# Patient Record
Sex: Male | Born: 1935 | Race: White | Hispanic: No | Marital: Married | Smoking: Never smoker
Health system: Southern US, Community
[De-identification: ages and names within clinical notes are randomized; demographics above are authoritative.]

## PROBLEM LIST (undated history)

## (undated) DIAGNOSIS — C189 Malignant neoplasm of colon, unspecified: Secondary | ICD-10-CM

## (undated) DIAGNOSIS — I10 Essential (primary) hypertension: Secondary | ICD-10-CM

## (undated) DIAGNOSIS — C61 Malignant neoplasm of prostate: Secondary | ICD-10-CM

## (undated) DIAGNOSIS — E7800 Pure hypercholesterolemia, unspecified: Secondary | ICD-10-CM

## (undated) DIAGNOSIS — Z9289 Personal history of other medical treatment: Secondary | ICD-10-CM

## (undated) DIAGNOSIS — IMO0002 Reserved for concepts with insufficient information to code with codable children: Secondary | ICD-10-CM

## (undated) DIAGNOSIS — M5126 Other intervertebral disc displacement, lumbar region: Secondary | ICD-10-CM

## (undated) DIAGNOSIS — Z923 Personal history of irradiation: Secondary | ICD-10-CM

## (undated) HISTORY — PX: SHOULDER ARTHROSCOPY: SHX128

## (undated) HISTORY — DX: Pure hypercholesterolemia, unspecified: E78.00

## (undated) HISTORY — DX: Malignant neoplasm of prostate: C61

## (undated) HISTORY — DX: Essential (primary) hypertension: I10

## (undated) HISTORY — PX: CARDIAC CATHETERIZATION: SHX172

## (undated) HISTORY — DX: Personal history of other medical treatment: Z92.89

---

## 1991-06-13 HISTORY — PX: PROSTATECTOMY: SHX69

## 1996-06-12 DIAGNOSIS — IMO0001 Reserved for inherently not codable concepts without codable children: Secondary | ICD-10-CM

## 1996-06-12 HISTORY — DX: Reserved for inherently not codable concepts without codable children: IMO0001

## 1997-10-26 ENCOUNTER — Other Ambulatory Visit: Admission: RE | Admit: 1997-10-26 | Discharge: 1997-10-26 | Payer: Self-pay | Admitting: Cardiology

## 1998-05-18 ENCOUNTER — Ambulatory Visit (HOSPITAL_COMMUNITY): Admission: RE | Admit: 1998-05-18 | Discharge: 1998-05-18 | Payer: Self-pay | Admitting: Cardiology

## 1998-05-18 ENCOUNTER — Encounter: Payer: Self-pay | Admitting: Cardiology

## 2000-03-09 ENCOUNTER — Inpatient Hospital Stay (HOSPITAL_COMMUNITY): Admission: AD | Admit: 2000-03-09 | Discharge: 2000-03-19 | Payer: Self-pay

## 2000-03-19 ENCOUNTER — Inpatient Hospital Stay (HOSPITAL_COMMUNITY)
Admission: RE | Admit: 2000-03-19 | Discharge: 2000-03-22 | Payer: Self-pay | Admitting: Physical Medicine & Rehabilitation

## 2002-02-25 ENCOUNTER — Ambulatory Visit (HOSPITAL_COMMUNITY): Admission: RE | Admit: 2002-02-25 | Discharge: 2002-02-25 | Payer: Self-pay

## 2002-08-12 ENCOUNTER — Ambulatory Visit (HOSPITAL_COMMUNITY): Admission: RE | Admit: 2002-08-12 | Discharge: 2002-08-12 | Payer: Self-pay | Admitting: Gastroenterology

## 2002-09-12 ENCOUNTER — Ambulatory Visit (HOSPITAL_COMMUNITY): Admission: RE | Admit: 2002-09-12 | Discharge: 2002-09-12 | Payer: Self-pay | Admitting: Cardiology

## 2004-05-17 ENCOUNTER — Encounter: Admission: RE | Admit: 2004-05-17 | Discharge: 2004-05-17 | Payer: Self-pay | Admitting: Orthopedic Surgery

## 2004-05-19 ENCOUNTER — Ambulatory Visit (HOSPITAL_BASED_OUTPATIENT_CLINIC_OR_DEPARTMENT_OTHER): Admission: RE | Admit: 2004-05-19 | Discharge: 2004-05-19 | Payer: Self-pay | Admitting: Orthopedic Surgery

## 2004-05-19 ENCOUNTER — Ambulatory Visit (HOSPITAL_COMMUNITY): Admission: RE | Admit: 2004-05-19 | Discharge: 2004-05-19 | Payer: Self-pay | Admitting: Orthopedic Surgery

## 2006-09-24 ENCOUNTER — Ambulatory Visit: Payer: Self-pay | Admitting: Vascular Surgery

## 2008-10-10 DIAGNOSIS — Z9289 Personal history of other medical treatment: Secondary | ICD-10-CM

## 2008-10-10 HISTORY — DX: Personal history of other medical treatment: Z92.89

## 2008-10-14 ENCOUNTER — Encounter: Payer: Self-pay | Admitting: Cardiology

## 2010-04-14 ENCOUNTER — Ambulatory Visit: Payer: Self-pay | Admitting: Cardiology

## 2010-06-12 HISTORY — PX: OTHER SURGICAL HISTORY: SHX169

## 2010-10-16 ENCOUNTER — Other Ambulatory Visit: Payer: Self-pay | Admitting: Cardiology

## 2010-10-16 DIAGNOSIS — I10 Essential (primary) hypertension: Secondary | ICD-10-CM

## 2010-10-20 ENCOUNTER — Other Ambulatory Visit: Payer: Self-pay | Admitting: Cardiology

## 2010-10-28 ENCOUNTER — Other Ambulatory Visit: Payer: Self-pay | Admitting: *Deleted

## 2010-10-28 ENCOUNTER — Encounter: Payer: Self-pay | Admitting: Cardiology

## 2010-10-28 DIAGNOSIS — E7800 Pure hypercholesterolemia, unspecified: Secondary | ICD-10-CM
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
## 2010-10-28 NOTE — Op Note (Signed)
NAME:  Tony James, Tony James NO.:  [id number]   MEDICAL RECORD NO.:  [id number]          PATIENT TYPE:  AMB   LOCATION:  DSC                          FACILITY:  [facility]   PHYSICIAN:  Loreta Ave, M.D. DATE OF BIRTH:  12-20-1922   DATE OF PROCEDURE:  05/19/2004  DATE OF DISCHARGE:                                 OPERATIVE REPORT   PREOPERATIVE DIAGNOSIS:  Impingement, partial rotator cuff tear,  degenerative joint disease acromioclavicular joint, adhesive capsulitis,  left shoulder.   POSTOPERATIVE DIAGNOSIS:  Impingement, partial rotator cuff tear,  degenerative joint disease acromioclavicular joint, adhesive capsulitis,  left shoulder.   OPERATION PERFORMED:  Left shoulder exam and manipulation under anesthesia.  Arthroscopy with debridement of labrum, removal of loose bodies, debridement  of glenohumeral joint.  Acromioplasty with coracoacromial ligament release.  Excision of distal clavicle.   SURGEON:  Loreta Ave, M.D.   ASSISTANT:  Genene Churn. Denton Meek.   ANESTHESIA:  General.   ESTIMATED BLOOD LOSS:  Minimal.   SPECIMENS:  None.   CULTURES:  None.   COMPLICATIONS:  None.   DRESSING:  Soft compressive with sling.   DESCRIPTION OF PROCEDURE:  The patient was brought to the operating room and  after adequate anesthesia had been obtained, the left shoulder was examined.  Motion reduced 50% all planes from adhesions.  With gentle manipulation  breaking up adhesions, achieved full motion, maintaining stable shoulder  without adverse occurrence.  Placed in a beach chair position on the  shoulder positioner, prepped and draped in the usual sterile fashion.  Three  standard arthroscopic portals, anterior, posterior lateral.  Shoulder  entered with blunt obturator, distended and inspected.  Grade 2 and 3 and in  some areas approaching grade four changes, glenohumeral joint mostly changes  on the humerus.  Numerous chondral loose bodies removed.   Superior labrum  tear with partially tethered loose bodies, all of this debrided.  Biceps  tendon, biceps anchor were still intact as were capsular ligamentous  structures.  Obvious tearing of inferior capsule from his manipulation.  Undersurface of rotator cuff intact but a little bit thinned.  Once the  glenohumeral joint debrided, cannula redirected subacromially.  Type 3  acromion, chronic impingement.  Bursa resected, cuff debrided.  No full  thickness tears.  Acromioplasty from a type 3 to a type 1 acromion releasing  coracoacromial ligament.  Distal clavicle grade 4 changes with marked spurs.  Lateral centimeter resected as well as periarticular spurs.  Adequacy of  decompression, clavicle excision, cuff debridement confirmed viewing from  all  portals.  Instruments and fluid removed.  Portals of shoulder and bursa  injected with Marcaine.  Portals closed with 4-0 nylon.  Sterile compressive  dressing with sling applied.  Anesthesia reversed.  Brought to recovery  room.  Tolerated surgery well without complication.      Valentino Saxon   DFM/MEDQ  D:  05/19/2004  T:  05/20/2004  Job:  [id number]

## 2010-10-28 NOTE — Cardiovascular Report (Signed)
NAME:  Tony James, Tony James                           ACCOUNT NO.:  [id number]   MEDICAL RECORD NO.:  [id number]                   PATIENT TYPE:  OIB   LOCATION:  2899                                 FACILITY:  [facility]   PHYSICIAN:  Colleen Can. Deborah Chalk, M.D.            DATE OF BIRTH:  1922-08-31   DATE OF PROCEDURE:  DATE OF DISCHARGE:                              CARDIAC CATHETERIZATION   HISTORY:  The patient has longstanding hypertension.  He had an abnormal  exercise tolerance test and is referred for cardiac catheterization.   PROCEDURE:  Left heart catheterization with selective coronary angiography,  left ventricular angiography, and abdominal aortic angiography.   TYPE AND SITE OF ENTRY:  Percutaneous right femoral artery.   CATHETERS:  Judkins 6-French 4-curved right and left coronary catheters, 6-  French pigtail ventriculographic catheter.   MEDICATIONS GIVEN PRIOR TO PROCEDURE:  Valium 10 mg p.o.   MEDICATIONS GIVEN DURING PROCEDURE:  Versed 3 mg IV, Ancef 1 gm IV.   COMMENTS:  The patient tolerated the procedure well.  Perclose was used.   HEMODYNAMIC DATA:  1. The aortic pressure was 118/83.  2. LV was 127/4-14.  3. There was no aortic valve gradient noted on pullback.   ANGIOGRAPHIC DATA:  1. The abdominal aortogram was normal.  The aorta was smooth and tapered.     There was no aneurysmal dilatation.  Renal arteries were normal.  2. Left ventricular angiogram:  Left ventricular angiogram was performed in     the RAO position.  Overall cardiac size and silhouette were normal.     Global ejection fraction would be estimated to be 60%.  There was no     mitral regurgitation, intracardiac calcification, or intracavitary     filling defects.  3. Coronary arteries     A. Right coronary artery:  The right coronary artery was a moderately        large dominant vessel that extends to the apex.  There are 2 large        inferior branches, including posterolateral branch and  posterior        descending vessel.  There were normal.  4. Left main coronary artery:  This was short and normal.  5. Left circumflex:  The left circumflex continued as an obtuse marginal but     3 branches.  There was mild tortuosity.  It was normal.  6. Left anterior descending:  The left anterior descending was a reasonably     large system.  There were 2 large diagonal vessels.  It was normal.    OVERALL IMPRESSION:  1. Normal left ventricular function.  2. Normal coronary arteries.  3. Normal abdominal aortogram.  Colleen Can. Deborah Chalk, M.D.    SNT/MEDQ  D:  09/12/2002  T:  09/14/2002  Job:  [id number]

## 2010-10-31 ENCOUNTER — Other Ambulatory Visit (INDEPENDENT_AMBULATORY_CARE_PROVIDER_SITE_OTHER): Payer: Medicare Other | Admitting: *Deleted

## 2010-10-31 ENCOUNTER — Encounter: Payer: Self-pay | Admitting: Cardiology

## 2010-10-31 ENCOUNTER — Ambulatory Visit (INDEPENDENT_AMBULATORY_CARE_PROVIDER_SITE_OTHER): Payer: Medicare Other | Admitting: Cardiology

## 2010-10-31 DIAGNOSIS — I119 Hypertensive heart disease without heart failure: Secondary | ICD-10-CM

## 2010-10-31 DIAGNOSIS — E7800 Pure hypercholesterolemia, unspecified: Secondary | ICD-10-CM

## 2010-10-31 DIAGNOSIS — I10 Essential (primary) hypertension: Secondary | ICD-10-CM | POA: Insufficient documentation

## 2010-10-31 LAB — HEPATIC FUNCTION PANEL
AST: 25 U/L (ref 0–37)
Alkaline Phosphatase: 41 U/L (ref 39–117)
Bilirubin, Direct: 0.1 mg/dL (ref 0.0–0.3)
Total Bilirubin: 0.7 mg/dL (ref 0.3–1.2)

## 2010-10-31 LAB — LIPID PANEL
LDL Cholesterol: 79 mg/dL (ref 0–99)
VLDL: 23.4 mg/dL (ref 0.0–40.0)

## 2010-10-31 LAB — BASIC METABOLIC PANEL
GFR: 49.9 mL/min — ABNORMAL LOW (ref >60.00)
Potassium: 4.7 mEq/L (ref 3.5–5.1)
Sodium: 138 mEq/L (ref 135–145)

## 2010-10-31 NOTE — Assessment & Plan Note (Signed)
We'll continue Lipitor and Zetia.

## 2010-10-31 NOTE — Assessment & Plan Note (Signed)
We will continue current medicines. Lab work is pending. I will have him see [facility] for followup. Appointments will be made. We'll continue current medicines.

## 2010-10-31 NOTE — Progress Notes (Signed)
Subjective:   Tony James is seen today for followup visit. In general, he's doing well. He had cataract surgery by Dr. Mckinley Jewel and has new hearing aids and is going quite well. Blood pressure readings at home have been excellent. He talked about my retirement. He has some mild hypertensive heart disease with mild LVH and mild renal insufficiency and basically has minimal cardiovascular issues. He had remote cardiac catheterization in 2004 with normal coronary arteries.  In April 2010, his BUN was 32 with creatinine of 1.4. He's had a total prostatectomy in 1993 for prostate cancer and subsequent had radiation therapy 1998. He is on hormone therapy but that was discontinued in 2006. There may be a slight return of increase in his PSA. He has a history of hypercholesterolemia on statin therapy.  Current Outpatient Prescriptions  Medication Sig Dispense Refill  . aspirin 81 MG tablet Take 81 mg by mouth daily.        Marland Kitchen atenolol (TENORMIN) 50 MG tablet TAKE ONE-HALF (1/2) TABLET IN THE MORNING AND ONE TABLET IN THE EVENING  135 tablet  2  . atorvastatin (LIPITOR) 40 MG tablet Take 40 mg by mouth daily.        . Calcium Carbonate-Vitamin D (CALCIUM + D) 600-200 MG-UNIT TABS Take 1 tablet by mouth 2 (two) times daily.        . enalapril (VASOTEC) 20 MG tablet Take 1 tablet (20 mg total) by mouth daily.  90 tablet  2  . ezetimibe (ZETIA) 10 MG tablet Take 10 mg by mouth daily.        . felodipine (PLENDIL) 10 MG 24 hr tablet Take 10 mg by mouth daily.        . hydrochlorothiazide 25 MG tablet Take 25 mg by mouth daily. Take every other day         No Known Allergies  Patient Active Problem List  Diagnoses  . Hypertension  . Hypercholesterolemia  . Hypertensive heart disease    History  Smoking status  . Never Smoker   Smokeless tobacco  . Never Used    History  Alcohol Use  . Yes    social use only    Family History  Problem Relation Age of Onset  . Heart disease Father      Review of Systems:   The patient denies any heat or cold intolerance.  No weight gain or weight loss.  The patient denies headaches or blurry vision.  There is no cough or sputum production.  The patient denies dizziness.  There is no hematuria or hematochezia.  The patient denies any muscle aches or arthritis.  The patient denies any rash.  The patient denies frequent falling or instability.  There is no history of depression or anxiety.  All other systems were reviewed and are negative.   Physical Exam:   Weight is 217. Blood pressure is 140/70 sitting, heart rate 60.The head is normocephalic and atraumatic.  Pupils are equally round and reactive to light.  Sclerae nonicteric.  Conjunctiva is clear.  Oropharynx is unremarkable.  There's adequate oral airway.  Neck is supple there are no masses.  Thyroid is not enlarged.  There is no lymphadenopathy.  Lungs are clear.  Chest is symmetric.  Heart shows a regular rate and rhythm.  S1 and S2 are normal.  There is no murmur click or gallop.  Abdomen is soft normal bowel sounds.  There is no organomegaly.  Genital and rectal deferred.  Extremities are without  edema.  Peripheral pulses are adequate.  Neurologically intact.  Full range of motion.  The patient is not depressed.  Skin is warm and dry.  Assessment / Plan:

## 2010-10-31 NOTE — Progress Notes (Signed)
Subjective:     Current Outpatient Prescriptions  Medication Sig Dispense Refill  . aspirin 81 MG tablet Take 81 mg by mouth daily.        Marland Kitchen atenolol (TENORMIN) 50 MG tablet TAKE ONE-HALF (1/2) TABLET IN THE MORNING AND ONE TABLET IN THE EVENING  135 tablet  2  . atorvastatin (LIPITOR) 40 MG tablet Take 40 mg by mouth daily.        . Calcium Carbonate-Vitamin D (CALCIUM + D) 600-200 MG-UNIT TABS Take 1 tablet by mouth 2 (two) times daily.        . enalapril (VASOTEC) 20 MG tablet Take 1 tablet (20 mg total) by mouth daily.  90 tablet  2  . ezetimibe (ZETIA) 10 MG tablet Take 10 mg by mouth daily.        . felodipine (PLENDIL) 10 MG 24 hr tablet Take 10 mg by mouth daily.        . hydrochlorothiazide 25 MG tablet Take 25 mg by mouth daily. Take every other day         No Known Allergies  Patient Active Problem List  Diagnoses  . Hypertension  . Hypercholesterolemia  . Hypertensive heart disease    History  Smoking status  . Never Smoker   Smokeless tobacco  . Never Used    History  Alcohol Use  . Yes    social use only    Family History  Problem Relation Age of Onset  . Heart disease Father     Review of Systems:   The patient denies any heat or cold intolerance.  No weight gain or weight loss.  The patient denies headaches or blurry vision.  There is no cough or sputum production.  The patient denies dizziness.  There is no hematuria or hematochezia.  The patient denies any muscle aches or arthritis.  The patient denies any rash.  The patient denies frequent falling or instability.  There is no history of depression or anxiety.  All other systems were reviewed and are negative.   Physical Exam:    Assessment / Plan:

## 2010-11-01 ENCOUNTER — Telehealth: Payer: Self-pay | Admitting: *Deleted

## 2010-11-01 NOTE — Telephone Encounter (Signed)
Labs reported 

## 2011-01-20 ENCOUNTER — Other Ambulatory Visit: Payer: Self-pay | Admitting: Cardiology

## 2011-01-20 NOTE — Telephone Encounter (Signed)
escribe medication per fax request  

## 2011-04-14 ENCOUNTER — Other Ambulatory Visit: Payer: Self-pay | Admitting: Nurse Practitioner

## 2011-06-21 ENCOUNTER — Other Ambulatory Visit: Payer: Self-pay | Admitting: *Deleted

## 2011-06-21 DIAGNOSIS — I10 Essential (primary) hypertension: Secondary | ICD-10-CM

## 2011-06-21 MED ORDER — ENALAPRIL MALEATE 20 MG PO TABS: 20.0000 mg | ORAL_TABLET | Freq: Every day | ORAL | Status: DC

## 2012-09-03 ENCOUNTER — Other Ambulatory Visit: Payer: Self-pay | Admitting: Gastroenterology

## 2012-09-03 DIAGNOSIS — C20 Malignant neoplasm of rectum: Secondary | ICD-10-CM

## 2012-09-03 DIAGNOSIS — C189 Malignant neoplasm of colon, unspecified: Secondary | ICD-10-CM

## 2012-09-03 HISTORY — PX: OTHER SURGICAL HISTORY: SHX169

## 2012-09-03 HISTORY — DX: Malignant neoplasm of colon, unspecified: C18.9

## 2012-09-04 ENCOUNTER — Other Ambulatory Visit: Payer: Self-pay | Admitting: Gastroenterology

## 2012-09-04 DIAGNOSIS — C20 Malignant neoplasm of rectum: Secondary | ICD-10-CM

## 2012-09-05 ENCOUNTER — Other Ambulatory Visit: Payer: Self-pay | Admitting: Gastroenterology

## 2012-09-05 ENCOUNTER — Ambulatory Visit
Admission: RE | Admit: 2012-09-05 | Discharge: 2012-09-05 | Disposition: A | Discharge status: Home / Self Care | Payer: Medicare Other | Source: Ambulatory Visit | Attending: Gastroenterology | Admitting: Gastroenterology

## 2012-09-05 ENCOUNTER — Telehealth: Payer: Self-pay | Admitting: *Deleted

## 2012-09-05 DIAGNOSIS — C20 Malignant neoplasm of rectum: Secondary | ICD-10-CM

## 2012-09-05 MED ORDER — IOHEXOL 300 MG/ML  SOLN
125.0000 mL | Freq: Once | INTRAMUSCULAR | Status: AC | PRN
  Administered 2012-09-05: 125 mL via INTRAVENOUS

## 2012-09-05 NOTE — Telephone Encounter (Signed)
Spoke with patient by phone and confirmed appointment with Dr. Truett Perna for 09/13/12.  Contact names and phone numbers were provided.

## 2012-09-05 NOTE — Telephone Encounter (Signed)
Pt scheduled per [name]. Welcome packet mailed.

## 2012-09-05 NOTE — Addendum Note (Signed)
Addended by: Willis Modena on: 09/05/2012 05:32 PM   Modules accepted: Orders

## 2012-09-06 ENCOUNTER — Encounter (HOSPITAL_COMMUNITY)
Admission: RE | Disposition: A | Discharge status: Home / Self Care | Payer: Self-pay | Source: Ambulatory Visit | Attending: Gastroenterology

## 2012-09-06 ENCOUNTER — Ambulatory Visit (HOSPITAL_COMMUNITY)
Admission: RE | Admit: 2012-09-06 | Discharge: 2012-09-06 | Disposition: A | Discharge status: Home / Self Care | Payer: Medicare Other | Source: Ambulatory Visit | Attending: Gastroenterology | Admitting: Gastroenterology

## 2012-09-06 ENCOUNTER — Encounter (HOSPITAL_COMMUNITY): Payer: Self-pay | Admitting: *Deleted

## 2012-09-06 DIAGNOSIS — C20 Malignant neoplasm of rectum: Secondary | ICD-10-CM | POA: Insufficient documentation

## 2012-09-06 DIAGNOSIS — Z9079 Acquired absence of other genital organ(s): Secondary | ICD-10-CM | POA: Insufficient documentation

## 2012-09-06 HISTORY — PX: EUS: SHX5427

## 2012-09-06 HISTORY — DX: Other intervertebral disc displacement, lumbar region: M51.26

## 2012-09-06 SURGERY — ULTRASOUND, LOWER GI TRACT, ENDOSCOPIC
Anesthesia: Moderate Sedation

## 2012-09-06 MED ORDER — SODIUM CHLORIDE 0.9 % IV SOLN
INTRAVENOUS | Status: DC
  Administered 2012-09-06: 500 mL via INTRAVENOUS

## 2012-09-06 MED ORDER — MIDAZOLAM HCL 10 MG/2ML IJ SOLN
INTRAMUSCULAR | Status: DC | PRN
  Administered 2012-09-06: 2 mg via INTRAVENOUS

## 2012-09-06 MED ORDER — DIPHENHYDRAMINE HCL 50 MG/ML IJ SOLN
INTRAMUSCULAR | Status: AC
  Filled 2012-09-06: qty 1

## 2012-09-06 MED ORDER — FENTANYL CITRATE 0.05 MG/ML IJ SOLN
INTRAMUSCULAR | Status: DC | PRN
  Administered 2012-09-06: 25 ug via INTRAVENOUS

## 2012-09-06 MED ORDER — MIDAZOLAM HCL 10 MG/2ML IJ SOLN
INTRAMUSCULAR | Status: AC
  Filled 2012-09-06: qty 2

## 2012-09-06 MED ORDER — FENTANYL CITRATE 0.05 MG/ML IJ SOLN
INTRAMUSCULAR | Status: AC
  Filled 2012-09-06: qty 2

## 2012-09-06 NOTE — H&P (Signed)
Patient interval history reviewed.  Patient examined again.  There has been no change from documented H/P dated 09/02/12 (scanned into chart from our office) except as documented above.  Assessment:  1.  Rectal mass.  Plan:  1.  Endorectal ultrasound. 2.  Risks (bleeding, infection, bowel perforation that could require surgery, sedation-related changes in cardiopulmonary systems), benefits (identification and possible treatment of source of symptoms, exclusion of certain causes of symptoms), and alternatives (watchful waiting, radiographic imaging studies, empiric medical treatment) of endorectal ultrasound were explained to patient/family in detail and patient wishes to proceed.

## 2012-09-06 NOTE — Op Note (Addendum)
[facility] [address] [place], [address]   OPERATIVE PROCEDURE REPORT  PATIENT: Tony James, Tony James  MR#: [id number] BIRTHDATE: 1922-11-04  GENDER: Male ENDOSCOPIST: Willis Modena, MD REFERRED BY:  Gaynelle Arabian, M.D.  Vida Rigger, M.D.  Mardelle Matte, M.D.  Radene Gunning, M.D, Ph.D. PROCEDURE DATE:  09/06/2012 PROCEDURE:   Flexible sigmoidoscopy EUS ASA CLASS:   Class II INDICATIONS:  Rectal cancer. MEDICATIONS: Fentanyl-Detailed 25 mcg IV and Versed 2 mg IV  DESCRIPTION OF PROCEDURE:   After the risks benefits and alternatives of the procedure were thoroughly explained, informed consent was obtained.  Throughout the procedure, the patients blood pressure, pulse and oxygen saturations were monitored continuously. Under direct visualization, the diagnostic endoscope and radial  echoendoscope was introduced through the anus  and advanced to the sigmoid colon .  Water was used as necessary to provide an acoustic interface.  Imaging was obtained at 7.5 and . Upon completion of the imaging, water was removed and the patient was sent to the recovery room in satisfactory condition.    FINDINGS:  Firm, fixed mass palpated anteriorally on digital rectal exam.  Mass readily seen endoscopically, and is about 4 x 5 cm in size, ulcerated, friable with heaped-up margins.  Tumor extends from 4cm to 8cm from the anal verge.  Upon retroflexion, there appears to be only 1-2 cm of normal mucosa separating the most distal margin of the tumor from the dentate line.  EUS scope was then inserted and water instilled to facilitate acoustic coupling. Lesion is  40% circumferential and appears to penetrate through the muscularis propria in a few locations.  Prostate has been removed surgically.  No peritumoral or mesorectal adenopathy was seen.  STAGING: T3 N0 Mx.  ENDOSCOPIC IMPRESSION: As above.  Distal rectal adenocarcinoma, distal margin of which is in very close  proximity to the dentate line.  RECOMMENDATIONS: 1.  Watch for potential complications of procedure. 2.  Patient would likely benefit from neoadjuvant chemotherapy and radiation prior to consideration of surgical intervention. 3.  Will discuss case with Dr. Ewing Schlein.   _______________________________ Rosalie DoctorWillis Modena, MD 09/06/2012 2:40 PM Revised: 09/06/2012 2:40 PM  CC:

## 2012-09-09 ENCOUNTER — Ambulatory Visit: Payer: Medicare Other

## 2012-09-09 ENCOUNTER — Ambulatory Visit: Payer: Medicare Other | Admitting: Oncology

## 2012-09-09 ENCOUNTER — Other Ambulatory Visit: Payer: Medicare Other | Admitting: Lab

## 2012-09-09 ENCOUNTER — Ambulatory Visit
Admission: RE | Admit: 2012-09-09 | Discharge: 2012-09-09 | Disposition: A | Discharge status: Home / Self Care | Payer: Medicare Other | Source: Ambulatory Visit | Attending: Radiation Oncology | Admitting: Radiation Oncology

## 2012-09-09 ENCOUNTER — Encounter (HOSPITAL_COMMUNITY): Payer: Self-pay | Admitting: Gastroenterology

## 2012-09-09 VITALS — BP 143/49 | HR 67 | Wt 228.5 lb

## 2012-09-09 DIAGNOSIS — Z79899 Other long term (current) drug therapy: Secondary | ICD-10-CM | POA: Insufficient documentation

## 2012-09-09 DIAGNOSIS — E7800 Pure hypercholesterolemia, unspecified: Secondary | ICD-10-CM | POA: Insufficient documentation

## 2012-09-09 DIAGNOSIS — C20 Malignant neoplasm of rectum: Secondary | ICD-10-CM | POA: Insufficient documentation

## 2012-09-09 DIAGNOSIS — I119 Hypertensive heart disease without heart failure: Secondary | ICD-10-CM | POA: Insufficient documentation

## 2012-09-09 DIAGNOSIS — Z923 Personal history of irradiation: Secondary | ICD-10-CM | POA: Insufficient documentation

## 2012-09-09 DIAGNOSIS — Z8546 Personal history of malignant neoplasm of prostate: Secondary | ICD-10-CM | POA: Insufficient documentation

## 2012-09-09 DIAGNOSIS — Z9079 Acquired absence of other genital organ(s): Secondary | ICD-10-CM | POA: Insufficient documentation

## 2012-09-09 HISTORY — DX: Malignant neoplasm of colon, unspecified: C18.9

## 2012-09-09 HISTORY — DX: Reserved for concepts with insufficient information to code with codable children: IMO0002

## 2012-09-09 NOTE — Progress Notes (Signed)
Dr.Ramesh and wife here for consultation of newly diagnosed colon cancer.Patient underwent prostate cancer radiation under guidance of Dr.Goodchild in 1998.A retired Development worker, community since 1986.Denies pain.No further rectal bleeding.Scheduled to see Dr.Sherrill Friday 09/13/2012.Prostatectomy in 1993.

## 2012-09-09 NOTE — Progress Notes (Signed)
Please see the Nurse Progress Note in the MD Initial Consult Encounter for this patient. 

## 2012-09-10 DIAGNOSIS — C20 Malignant neoplasm of rectum: Secondary | ICD-10-CM | POA: Insufficient documentation
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
## 2012-09-11 NOTE — Addendum Note (Signed)
Encounter addended by: Jonna Coup, MD on: 09/11/2012 11:54 AM<BR>     Documentation filed: Notes Section

## 2012-09-13 ENCOUNTER — Telehealth: Payer: Self-pay | Admitting: Oncology

## 2012-09-13 ENCOUNTER — Ambulatory Visit (HOSPITAL_BASED_OUTPATIENT_CLINIC_OR_DEPARTMENT_OTHER): Payer: Medicare Other | Admitting: Oncology

## 2012-09-13 ENCOUNTER — Ambulatory Visit: Payer: Medicare Other

## 2012-09-13 ENCOUNTER — Encounter: Payer: Self-pay | Admitting: Oncology

## 2012-09-13 VITALS — BP 134/61 | HR 74 | Temp 97.9°F | Resp 20 | Ht 71.0 in | Wt 228.2 lb

## 2012-09-13 DIAGNOSIS — C61 Malignant neoplasm of prostate: Secondary | ICD-10-CM

## 2012-09-13 DIAGNOSIS — C20 Malignant neoplasm of rectum: Secondary | ICD-10-CM

## 2012-09-13 DIAGNOSIS — R911 Solitary pulmonary nodule: Secondary | ICD-10-CM
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
## 2012-09-13 NOTE — Progress Notes (Signed)
Checked in new pt with no financial concerns. °

## 2012-09-13 NOTE — Telephone Encounter (Signed)
Gave pt appt for lMD visit on May 2014

## 2012-09-23 ENCOUNTER — Telehealth: Payer: Self-pay | Admitting: *Deleted

## 2012-09-23 NOTE — Telephone Encounter (Signed)
Spoke with pt; per Dr. Truett Perna informed pt that MD can see him 09/25/12 at 8:30 am.  Pt verbalized understanding and expressed appreciation for return call.

## 2012-09-23 NOTE — Telephone Encounter (Signed)
Received message from pt stating "from consulting with Dr. Byrd Hesselbach, he can't do surgery; would like to see Dr. Truett Perna sooner than 10/18/12 appt"  Note to Dr. Truett Perna.

## 2012-09-24 ENCOUNTER — Telehealth: Payer: Self-pay | Admitting: Oncology

## 2012-09-25 ENCOUNTER — Ambulatory Visit (HOSPITAL_BASED_OUTPATIENT_CLINIC_OR_DEPARTMENT_OTHER): Payer: Medicare Other | Admitting: Oncology

## 2012-09-25 ENCOUNTER — Telehealth: Payer: Self-pay | Admitting: Oncology

## 2012-09-25 VITALS — BP 137/72 | HR 62 | Temp 98.0°F | Resp 20 | Ht 71.0 in | Wt 224.2 lb

## 2012-09-25 DIAGNOSIS — C20 Malignant neoplasm of rectum: Secondary | ICD-10-CM

## 2012-09-25 NOTE — Progress Notes (Signed)
   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 Byrd Hesselbach last week. He reports Dr. Byrd Hesselbach does not recommend surgery secondary to the potential for complications. He recommends no treatment at present.  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 No bleeding or difficulty with bowel function. He feels well. He plans to move into a retirement community in the near future.  Objective:  Physical exam not performed today   Medications: I have reviewed the patient's current medications.  Assessment/Plan: 1.Rectal cancer, 1-2 centimeters above the dentate line,uT3N0, invasive adenocarcinoma confirmed on a colonoscopic biopsy 09/03/2012  2. Prostate cancer, status post a prostatectomy in 1993, pelvic radiation in 1998 for a rising PSA, subsequent hormonal therapy and now maintained off of specific therapy with a persistently elevated PSA  3. Left lower lobe 13 mm groundglass nodule on a CT of the chest 09/05/2012  4. Sclerotic focus in the T6 vertebra on the chest CT 09/05/2012   Disposition:  Dr. Lawerance Cruel appears asymptomatic from the rectal cancer.Dr. Byrd Hesselbach does not recommend surgery.  I discussed treatment options with Dr. Lawerance Cruel and his wife. We discussed chemotherapy and radiation. He understands it is very unlikely these therapies would be curative. There is a significant chance of decreasing the size of the rectal tumor with chemotherapy, but this would be associated with the potential for toxicity.  He is comfortable with an observation approach at present. He will return for an office visit in 3 months. I will discuss the case with Dr. Mitzi Hansen regarding the indication for additional radiation if needed. We could consider a palliative course of capecitabine and radiation if Dr. Mitzi Hansen feels this would be associated with acceptable toxicity.  Dr. Lawerance Cruel will contact us if he develops new symptoms.   [name],  MD  09/25/2012  3:19 PM

## 2012-10-01 ENCOUNTER — Telehealth: Payer: Self-pay | Admitting: Radiation Oncology

## 2012-10-01 NOTE — Telephone Encounter (Signed)
Faxed NPE 09/09/12 to [facility] [address], [phone].  OK per [facility].  Received confirmation.

## 2012-10-18 ENCOUNTER — Ambulatory Visit: Payer: Medicare Other | Admitting: Oncology

## 2012-12-24 ENCOUNTER — Ambulatory Visit (HOSPITAL_BASED_OUTPATIENT_CLINIC_OR_DEPARTMENT_OTHER): Payer: Medicare Other | Admitting: Oncology

## 2012-12-24 ENCOUNTER — Ambulatory Visit (HOSPITAL_BASED_OUTPATIENT_CLINIC_OR_DEPARTMENT_OTHER): Payer: Medicare Other | Admitting: Lab

## 2012-12-24 ENCOUNTER — Telehealth: Payer: Self-pay | Admitting: Oncology

## 2012-12-24 VITALS — BP 136/55 | HR 72 | Temp 98.3°F | Resp 20 | Ht 71.0 in | Wt 224.3 lb

## 2012-12-24 DIAGNOSIS — C20 Malignant neoplasm of rectum: Secondary | ICD-10-CM

## 2012-12-24 LAB — COMPREHENSIVE METABOLIC PANEL (CC13)
ALT: 15 U/L (ref 0–55)
AST: 20 U/L (ref 5–34)
Albumin: 3.4 g/dL — ABNORMAL LOW (ref 3.5–5.0)
CO2: 23 mEq/L (ref 22–29)
Calcium: 10.1 mg/dL (ref 8.4–10.4)
Chloride: 108 mEq/L (ref 98–109)
Creatinine: 1.5 mg/dL — ABNORMAL HIGH (ref 0.7–1.3)
Potassium: 4.5 mEq/L (ref 3.5–5.1)
Sodium: 144 mEq/L (ref 136–145)
Total Protein: 6.6 g/dL (ref 6.4–8.3)

## 2012-12-24 LAB — CBC WITH DIFFERENTIAL/PLATELET
BASO%: 0.4 % (ref 0.0–2.0)
HCT: 31.8 % — ABNORMAL LOW (ref 38.4–49.9)
HGB: 10.7 g/dL — ABNORMAL LOW (ref 13.0–17.1)
MCHC: 33.6 g/dL (ref 32.0–36.0)
MONO#: 0.9 10*3/uL (ref 0.1–0.9)
NEUT%: 63.6 % (ref 39.0–75.0)
RDW: 13.6 % (ref 11.0–14.6)
WBC: 7.9 10*3/uL (ref 4.0–10.3)
lymph#: 1.9 10*3/uL (ref 0.9–3.3)

## 2012-12-24 NOTE — Progress Notes (Signed)
GI Location of Tumor / Histology:  Rectal invasive adenocarcinoma  Patient presented to urology with a rectal mass on exam.  Biopsies of colon (if applicable) revealed: invasive adenocarcinoma  Past/Anticipated interventions by surgeon, if any: none  Past/Anticipated interventions by medical oncology, if any: possible capecitabine   Weight changes, if any: no  Bowel/Bladder complaints, if any:  He has rectal bleeding. He has developed rectal urgency. He reports episodes of leaking stool.   Nausea / Vomiting, if any: no  Pain issues, if any:  Occasional pain in lower back  SAFETY ISSUES:  Prior radiation? 1998 - 59.34 gray for prostate cancer by Dr. Dan Humphreys  Pacemaker/ICD? no  Possible current pregnancy? no  Is the patient on methotrexate? no  Current Complaints / other details:  Tony James here for consult for rectal cancer.  He denies pain but does have occasional back twinges.  He is having rectal bleeding which is effecting his hgb.

## 2012-12-24 NOTE — Progress Notes (Signed)
   [facility]    OFFICE PROGRESS NOTE   INTERVAL HISTORY:   Tony James returns as scheduled. He complains of increased malaise. He has rectal bleeding. He has developed rectal urgency. Good appetite. No significant pain. He reports episodes of leaking stool.  Objective:  Vital signs in last 24 hours:  There were no vitals taken for this visit.    HEENT: Neck without mass Lymphatics: No cervical, supra-clavicular, axillary, or inguinal nodes Resp: Lungs clear bilaterally Cardio: Regular rate and rhythm GI: No hepatomegaly, no mass, rectal-tumor is palpated at the anterior rectum, partially circumferential, there is blood on the examination glove, good sphincter tone Vascular: No leg edema Musculoskeletal: Mild tenderness at the right lower iliac/sacrum    Portacath/PICC-without erythema  Lab Results:  Hemoglobin 10.7, platelets 259,000, MCV 96.5 white count 7.9, ANC 5.0, BUN 40.3, creatinine 1.5   Medications: I have reviewed the patient's current medications.  Assessment/Plan: 1.Rectal cancer, 1-2 centimeters above the dentate line,uT3N0, invasive adenocarcinoma confirmed on a colonoscopic biopsy 09/03/2012  2. Prostate cancer, status post a prostatectomy in 1993, pelvic radiation in 1998 for a rising PSA, subsequent hormonal therapy and now maintained off of specific therapy with a persistently elevated PSA  3. Left lower lobe 13 mm groundglass nodule on a CT of the chest 09/05/2012  4. Sclerotic focus in the T6 vertebra on the chest CT 09/05/2012  5. Elevated BUN/creatinine 6. Rectal urgency and bleeding secondary to #1 7. Anemia    Disposition:  He now appears symptomatic from the rectal cancer. He has developed mild anemia. I discussed treatment options with Tony James. He agrees to treatment. I will consult Dr. Mitzi James for consideration of concurrent capecitabine and radiation. I reviewed the specific toxicities associated with capecitabine including  the chance for mucositis, diarrhea, hematologic toxicity, hyperpigmentation, rash, and the hand/foot syndrome. The plan is to proceed with a course of concurrent capecitabine/radiation if Dr. Mitzi James feels radiation can be safely delivered. If not we will consider systemic chemotherapy with capecitabine and oxaliplatin.  Tony James will return for an office visit in 3 weeks.   Tony Papas, MD  12/24/2012  11:03 AM

## 2012-12-24 NOTE — Telephone Encounter (Signed)
Gave pt appt ML and sent pt to labs to today

## 2012-12-25 ENCOUNTER — Ambulatory Visit
Admission: RE | Admit: 2012-12-25 | Discharge: 2012-12-25 | Disposition: A | Discharge status: Home / Self Care | Payer: Medicare Other | Source: Ambulatory Visit | Attending: Radiation Oncology | Admitting: Radiation Oncology

## 2012-12-25 ENCOUNTER — Telehealth: Payer: Self-pay | Admitting: *Deleted

## 2012-12-25 VITALS — BP 146/63 | HR 65 | Temp 98.0°F | Ht 71.0 in | Wt 223.7 lb

## 2012-12-25 DIAGNOSIS — Z9079 Acquired absence of other genital organ(s): Secondary | ICD-10-CM | POA: Insufficient documentation

## 2012-12-25 DIAGNOSIS — Z923 Personal history of irradiation: Secondary | ICD-10-CM | POA: Insufficient documentation

## 2012-12-25 DIAGNOSIS — R5383 Other fatigue: Secondary | ICD-10-CM | POA: Insufficient documentation

## 2012-12-25 DIAGNOSIS — R5381 Other malaise: Secondary | ICD-10-CM | POA: Insufficient documentation

## 2012-12-25 DIAGNOSIS — C20 Malignant neoplasm of rectum: Secondary | ICD-10-CM

## 2012-12-25 DIAGNOSIS — Z79899 Other long term (current) drug therapy: Secondary | ICD-10-CM | POA: Insufficient documentation

## 2012-12-25 LAB — CEA: CEA: 2.4 ng/mL (ref 0.0–5.0)

## 2012-12-25 NOTE — Telephone Encounter (Signed)
Pt returned call, lab results given. He voiced understanding/ agreement with MD. Has appt with Dr. Mitzi Hansen today.

## 2012-12-25 NOTE — Progress Notes (Signed)
Please see the Nurse Progress Note in the MD Initial Consult Encounter for this patient. 

## 2012-12-25 NOTE — Telephone Encounter (Signed)
Message copied by Caleb Popp on Wed Dec 25, 2012  8:54 AM ------      Message from: [name] B      Created: Tue Dec 24, 2012  5:34 PM       Please call patient, hb mildly decreased ,likely due to rectal bleeding, may explain his malaise. Does not look like iron defic.      F/u with Dr. Mitzi Hansen as scheduled to consider xeloda/xrt.            creatitine mildly elevated ------

## 2012-12-26 ENCOUNTER — Telehealth: Payer: Self-pay | Admitting: *Deleted

## 2012-12-26 NOTE — Telephone Encounter (Signed)
CALLED PATIENT TO INFORM OF TEST FOR 12-31-12, LVM FOR A RETURN CALL

## 2012-12-26 NOTE — Progress Notes (Signed)
Radiation Oncology         [phone]) [phone] ________________________________  Name: Tony James MRN: [id number]  Date: 12/25/2012  DOB: 01-Apr-1923  Follow-Up Visit Note  CC: Tony Pounds, MD  Tony Artist, MD  Diagnosis:   Adenocarcinoma of the rectum, T3, N0, M0   Narrative:  The patient returns today for routine follow-up.  The patient was previously seen in early April after her having been diagnosed with adenocarcinoma of the rectum staged as above. The patient does have a history of post prostatectomy radiotherapy to a dose of 59.4 gray in 1998. He was evaluated for surgical resection but this was not recommended. The patient therefore has been under observation. However, the patient has been having some local issues from the rectal cancer recently which has prompted reevaluation. This primarily has involved some blood loss which has led to fatigue which is the patient's primary complaint today. He does feel some fullness in the rectal region with bowel movements but otherwise has not had major local issues. He has had a tendency towards loose stools since his prior prostate treatment and he has not noticed any major change in this. No real pain in the pelvic region.                              ALLERGIES:  has No Known Allergies.  Meds: Current Outpatient Prescriptions  Medication Sig Dispense Refill  . aspirin 81 MG tablet Take 81 mg by mouth daily.       Marland Kitchen atenolol (TENORMIN) 50 MG tablet TAKE ONE-HALF (1/2) TABLET IN THE MORNING AND ONE TABLET IN THE EVENING  135 tablet  2  . atorvastatin (LIPITOR) 40 MG tablet Take 40 mg by mouth daily.        . Cholecalciferol (VITAMIN D-1000 MAX ST) 1000 UNITS tablet Take 1,000 Units by mouth daily.      . enalapril (VASOTEC) 20 MG tablet Take 1 tablet (20 mg total) by mouth daily.  90 tablet  2  . ezetimibe (ZETIA) 10 MG tablet Take 10 mg by mouth daily.        . felodipine (PLENDIL) 10 MG 24 hr tablet TAKE 1 TABLET DAILY  30 tablet  0    . hydrochlorothiazide 25 MG tablet Take 25 mg by mouth daily. Take every other day        No current facility-administered medications for this encounter.    Physical Findings: The patient is in no acute distress. Patient is alert and oriented.  height is 5\' 11"  (1.803 m) and weight is 223 lb 11.2 oz (101.47 kg). His temperature is 98 F (36.7 C). His blood pressure is 146/63 and his pulse is 65. His oxygen saturation is 98%. .     Lab Findings: Lab Results  Component Value Date   WBC 7.9 12/24/2012   HGB 10.7* 12/24/2012   HCT 31.8* 12/24/2012   MCV 96.5 12/24/2012   PLT 259 12/24/2012     Radiographic Findings: No results found.  Impression:    The patient is having some bleeding from his rectal cancer and he is currently interested in treatment options. The difficulties in his case are related to his age/comorbidities as well as his prior treatment, especially radiotherapy which was completed in 1998 to the prostatic fossa. Dr. Truett James has asked me to see the patient again today for consideration of radiotherapy for palliative purposes given his recent difficulties.  I believe  that the patient can receive additional radiotherapy to a reasonable palliative dose. Given that this would be a course of reirradiation, this would be completed using IMRT   and daily image guidance to allow sparing to the maximum extent possible of nearby critical normal structures. I discussed this with the patient. We discussed the possibility of improvement in local control for some time, especially with regards to bleeding which often responds quickly with radiation treatment. We also discussed the possible side effects and risks of treatment. The patient is aware that a course of reirradiation does have high-risk than a standard initial course of treatment. The patient remains interested in proceeding with treatment.   Plan:  The patient will proceed with a simulation such that we can begin treatment  planning. I would anticipate treating the patient to 35 gray in 14 fractions at 2.5 gray per fraction. The patient may also benefit from sensitizing as a load and I will coordinate this with medical oncology.   I spent 20 minutes with the patient today, the majority of which was spent counseling the patient on the diagnosis of cancer and coordinating care.   Tony James, M.D., Ph.D.

## 2012-12-30 ENCOUNTER — Other Ambulatory Visit: Payer: Self-pay | Admitting: Medical Oncology

## 2012-12-30 DIAGNOSIS — C20 Malignant neoplasm of rectum: Secondary | ICD-10-CM

## 2012-12-30 NOTE — Progress Notes (Signed)
I gave Tony James a prescription for  Xeloda so she can get prior [place].

## 2012-12-31 ENCOUNTER — Encounter: Payer: Self-pay | Admitting: Oncology

## 2012-12-31 ENCOUNTER — Ambulatory Visit (HOSPITAL_COMMUNITY)
Admission: RE | Admit: 2012-12-31 | Discharge: 2012-12-31 | Disposition: A | Discharge status: Home / Self Care | Payer: Medicare Other | Source: Ambulatory Visit | Attending: Radiation Oncology | Admitting: Radiation Oncology

## 2012-12-31 DIAGNOSIS — R911 Solitary pulmonary nodule: Secondary | ICD-10-CM | POA: Insufficient documentation

## 2012-12-31 DIAGNOSIS — N281 Cyst of kidney, acquired: Secondary | ICD-10-CM | POA: Insufficient documentation

## 2012-12-31 DIAGNOSIS — Z9079 Acquired absence of other genital organ(s): Secondary | ICD-10-CM | POA: Insufficient documentation

## 2012-12-31 DIAGNOSIS — K4090 Unilateral inguinal hernia, without obstruction or gangrene, not specified as recurrent: Secondary | ICD-10-CM | POA: Insufficient documentation

## 2012-12-31 DIAGNOSIS — C20 Malignant neoplasm of rectum: Secondary | ICD-10-CM | POA: Insufficient documentation

## 2012-12-31 DIAGNOSIS — K449 Diaphragmatic hernia without obstruction or gangrene: Secondary | ICD-10-CM | POA: Insufficient documentation

## 2012-12-31 DIAGNOSIS — I700 Atherosclerosis of aorta: Secondary | ICD-10-CM | POA: Insufficient documentation

## 2012-12-31 DIAGNOSIS — M899 Disorder of bone, unspecified: Secondary | ICD-10-CM | POA: Insufficient documentation

## 2012-12-31 LAB — GLUCOSE, CAPILLARY: Glucose-Capillary: 112 mg/dL — ABNORMAL HIGH (ref 70–99)

## 2012-12-31 MED ORDER — FLUDEOXYGLUCOSE F - 18 (FDG) INJECTION
18.3000 | Freq: Once | INTRAVENOUS | Status: AC | PRN
  Administered 2012-12-31: 18.3 via INTRAVENOUS

## 2012-12-31 NOTE — Progress Notes (Signed)
Faxed xeloda prescription to [facility] °

## 2013-01-02 ENCOUNTER — Ambulatory Visit
Admission: RE | Admit: 2013-01-02 | Discharge: 2013-01-02 | Disposition: A | Discharge status: Home / Self Care | Payer: Medicare Other | Source: Ambulatory Visit | Attending: Radiation Oncology | Admitting: Radiation Oncology

## 2013-01-02 ENCOUNTER — Encounter: Payer: Self-pay | Admitting: *Deleted

## 2013-01-02 DIAGNOSIS — C20 Malignant neoplasm of rectum: Secondary | ICD-10-CM | POA: Insufficient documentation

## 2013-01-02 DIAGNOSIS — Z510 Encounter for antineoplastic radiation therapy: Secondary | ICD-10-CM | POA: Insufficient documentation

## 2013-01-02 DIAGNOSIS — R109 Unspecified abdominal pain: Secondary | ICD-10-CM | POA: Insufficient documentation

## 2013-01-02 DIAGNOSIS — R197 Diarrhea, unspecified: Secondary | ICD-10-CM | POA: Insufficient documentation

## 2013-01-02 NOTE — Progress Notes (Signed)
RECEIVED A FAX FROM [facility]CERNING A CONFIRMATION OF PRESCRIPTION SHIPMENT FOR CAPECITABINE ON 01/01/13.

## 2013-01-13 ENCOUNTER — Ambulatory Visit
Admission: RE | Admit: 2013-01-13 | Discharge: 2013-01-13 | Disposition: A | Discharge status: Home / Self Care | Payer: Medicare Other | Source: Ambulatory Visit | Attending: Radiation Oncology | Admitting: Radiation Oncology

## 2013-01-14 ENCOUNTER — Ambulatory Visit
Admission: RE | Admit: 2013-01-14 | Discharge: 2013-01-14 | Disposition: A | Discharge status: Home / Self Care | Payer: Medicare Other | Source: Ambulatory Visit | Attending: Radiation Oncology | Admitting: Radiation Oncology

## 2013-01-15 ENCOUNTER — Other Ambulatory Visit: Payer: Self-pay

## 2013-01-15 ENCOUNTER — Ambulatory Visit: Admission: RE | Admit: 2013-01-15 | Payer: Medicare Other | Source: Ambulatory Visit

## 2013-01-15 ENCOUNTER — Ambulatory Visit
Admission: RE | Admit: 2013-01-15 | Discharge: 2013-01-15 | Disposition: A | Discharge status: Home / Self Care | Payer: Medicare Other | Source: Ambulatory Visit | Attending: Radiation Oncology | Admitting: Radiation Oncology

## 2013-01-15 VITALS — BP 121/54 | HR 65 | Temp 98.6°F | Ht 71.0 in | Wt 220.9 lb

## 2013-01-15 DIAGNOSIS — C20 Malignant neoplasm of rectum: Secondary | ICD-10-CM

## 2013-01-15 NOTE — Progress Notes (Signed)
   Department of Radiation Oncology  Phone:  [phone] Fax:        [phone]  Weekly Treatment Note    Name: Tony James Date: 01/15/2013 MRN: [id number] DOB: [birth year]   Current dose: 5 Gy  Current fraction: 2   MEDICATIONS: Current Outpatient Prescriptions  Medication Sig Dispense Refill  . aspirin 81 MG tablet Take 81 mg by mouth daily.       Marland Kitchen atenolol (TENORMIN) 50 MG tablet TAKE ONE-HALF (1/2) TABLET IN THE MORNING AND ONE TABLET IN THE EVENING  135 tablet  2  . atorvastatin (LIPITOR) 40 MG tablet Take 40 mg by mouth daily.        . capecitabine (XELODA) 500 MG tablet Take by mouth 2 (two) times daily after a meal. 2000 mg in the am and 1500 mg at night      . Cholecalciferol (VITAMIN D-1000 MAX ST) 1000 UNITS tablet Take 1,000 Units by mouth daily.      . enalapril (VASOTEC) 20 MG tablet Take 1 tablet (20 mg total) by mouth daily.  90 tablet  2  . ezetimibe (ZETIA) 10 MG tablet Take 10 mg by mouth daily.        . felodipine (PLENDIL) 10 MG 24 hr tablet TAKE 1 TABLET DAILY  30 tablet  0  . hydrochlorothiazide 25 MG tablet Take 25 mg by mouth daily. Take every other day        No current facility-administered medications for this encounter.     ALLERGIES: Review of patient's allergies indicates no known allergies.   LABORATORY DATA:  Lab Results  Component Value Date   WBC 7.9 12/24/2012   HGB 10.7* 12/24/2012   HCT 31.8* 12/24/2012   MCV 96.5 12/24/2012   PLT 259 12/24/2012   Lab Results  Component Value Date   NA 144 12/24/2012   K 4.5 12/24/2012   CL 101 10/31/2010   CO2 23 12/24/2012   Lab Results  Component Value Date   ALT 15 12/24/2012   AST 20 12/24/2012   ALKPHOS 55 12/24/2012   BILITOT 0.43 12/24/2012     NARRATIVE: Tony James was seen today for weekly treatment management. The chart was checked and the patient's films were reviewed. The patient is doing well in his first week of treatment. No difficulties. Slight lower abdominal  pain which he rates as a 1/10. The patient is taking Xeloda. No change in bowel movements with chronic loose stools.  PHYSICAL EXAMINATION: height is 5\' 11"  (1.803 m) and weight is 220 lb 14.4 oz (100.2 kg). His temperature is 98.6 F (37 C). His blood pressure is 121/54 and his pulse is 65.        ASSESSMENT: The patient is doing satisfactorily with treatment.  PLAN: We will continue with the patient's radiation treatment as planned.

## 2013-01-15 NOTE — Progress Notes (Addendum)
Tony James here for weekly under treat visit.  He has had 2 fractions to his pelvis.  He stated that he has a dull ache in his lower abdomen that he rates as a 1/10.  He denies nausea and bladder changes.  He states that he has occasional diarrhea that he has had since 1998 and his last radiation treatment.  He is currently taking xeloda and does not have any nausea medication ordered.  He was given the radiation therapy and you book and discussed potential side effects of radiaton including diarrhea, fatigue, hair loss, skin changes and bladder changes.  Advised him to notify nursing if he had redness of his skin in the treatment area.  He had previously had radiation treatment and knew what to expect.  Advised him to contact nursing with any questions and concerns.

## 2013-01-16 ENCOUNTER — Ambulatory Visit
Admission: RE | Admit: 2013-01-16 | Discharge: 2013-01-16 | Disposition: A | Discharge status: Home / Self Care | Payer: Medicare Other | Source: Ambulatory Visit | Attending: Radiation Oncology | Admitting: Radiation Oncology

## 2013-01-16 ENCOUNTER — Other Ambulatory Visit: Payer: Self-pay | Admitting: *Deleted

## 2013-01-16 NOTE — Telephone Encounter (Signed)
Fax request from [facility] for Xeloda left for MD review and approval. Appt with Misty Stanley on 01/17/13.

## 2013-01-17 ENCOUNTER — Other Ambulatory Visit: Payer: Medicare Other | Admitting: Lab

## 2013-01-17 ENCOUNTER — Ambulatory Visit (HOSPITAL_BASED_OUTPATIENT_CLINIC_OR_DEPARTMENT_OTHER): Payer: Medicare Other | Admitting: Nurse Practitioner

## 2013-01-17 ENCOUNTER — Telehealth: Payer: Self-pay | Admitting: Oncology

## 2013-01-17 ENCOUNTER — Ambulatory Visit
Admission: RE | Admit: 2013-01-17 | Discharge: 2013-01-17 | Disposition: A | Discharge status: Home / Self Care | Payer: Medicare Other | Source: Ambulatory Visit | Attending: Radiation Oncology | Admitting: Radiation Oncology

## 2013-01-17 VITALS — BP 113/64 | HR 71 | Temp 97.4°F | Resp 18 | Ht 71.0 in | Wt 220.4 lb

## 2013-01-17 DIAGNOSIS — C20 Malignant neoplasm of rectum: Secondary | ICD-10-CM

## 2013-01-17 DIAGNOSIS — R197 Diarrhea, unspecified: Secondary | ICD-10-CM

## 2013-01-17 NOTE — Progress Notes (Signed)
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 He began radiation and Xeloda on 01/13/2013. He denies nausea/vomiting. No mouth sores. No hand or foot pain or redness. He had "a little diarrhea" this morning. He took some Imodium. He continues to have rectal bleeding. He denies rectal pain. He has a good appetite. He noted urinary frequency/urgency over night.   Objective: Blood pressure 113/64, pulse 71, temperature 97.4 F (36.3 C), temperature source Oral, resp. rate 18, height 5\' 11"  (1.803 m), weight 220 lb 6.4 oz (99.973 kg).  Oropharynx is without thrush or ulceration. Lungs are clear. No wheezes or rales. Regular cardiac rhythm. Abdomen is soft and nontender. No hepatomegaly. Extremities are without edema. Perianal region is mildly erythematous. No skin breakdown. Palms are without erythema.  Lab Results: Lab Results  Component Value Date   WBC 7.9 12/24/2012   HGB 10.7* 12/24/2012   HCT 31.8* 12/24/2012   MCV 96.5 12/24/2012   PLT 259 12/24/2012    Chemistry:    Chemistry      Component Value Date/Time   NA 144 12/24/2012 1155   NA 138 10/31/2010 1110   K 4.5 12/24/2012 1155   K 4.7 10/31/2010 1110   CL 101 10/31/2010 1110   CO2 23 12/24/2012 1155   CO2 27 10/31/2010 1110   BUN 40.3* 12/24/2012 1155   BUN 33* 10/31/2010 1110   CREATININE 1.5* 12/24/2012 1155   CREATININE 1.4 10/31/2010 1110      Component Value Date/Time   CALCIUM 10.1 12/24/2012 1155   CALCIUM 10.1 10/31/2010 1110   ALKPHOS 55 12/24/2012 1155   ALKPHOS 41 10/31/2010 1110   AST 20 12/24/2012 1155   AST 25 10/31/2010 1110   ALT 15 12/24/2012 1155   ALT 15 10/31/2010 1110   BILITOT 0.43 12/24/2012 1155   BILITOT 0.7 10/31/2010 1110       Studies/Results: Nm Pet Image Restag (ps) Skull Base To Thigh  12/31/2012   *RADIOLOGY REPORT*  Clinical Data: Subsequent treatment strategy for rectal cancer. Planned XRT.  NUCLEAR MEDICINE PET SKULL BASE TO THIGH  Fasting Blood Glucose:  112  Technique:   18.3 mCi F-18 FDG was injected intravenously. CT data was obtained and used for attenuation correction and anatomic localization only.  (This was not acquired as a diagnostic CT examination.) Additional exam technical data entered on technologist worksheet.  Comparison:  CT chest abdomen pelvis dated 09/05/2012.  Findings:  Neck: No hypermetabolic lymph nodes in the neck.  Chest:  10 mm vague ground-glass nodule in the left upper lobe (series 2/image 77), non-FDG-avid.  No hypermetabolic mediastinal or hilar nodes.  Small hiatal hernia.  Abdomen/Pelvis:  No abnormal hypermetabolic activity within the liver, pancreas, adrenal glands, or spleen.  No hypermetabolic lymph nodes in the abdomen or pelvis.  Eccentric wall thickening involving the left posterolateral wall of the rectum (series 2/image 230), max SUV 21.1, likely corresponding to known primary rectal cancer.  Prior prostatectomy with pelvic lymph node dissection. Bladder is mildly thick-walled but underdistended.  3.2 cm right upper pole renal cyst (series 2/image 147). Atherosclerotic calcifications of the abdominal aorta and branch vessels.  Small fat-containing left inguinal hernia.  Skeleton:  9 mm sclerotic lesion in the left T6 vertebral body (series 2/image 89), unchanged.  Possible mild focal hypermetabolism, max SUV 4.6 (PET image 87), equivocal.  Deformity involving the left lateral clavicle (series 2/image 61), left posterior eighth and ninth ribs, and in the left inferior pubic ramus (series 2/image  246).  Suspected bone island in the right inferior pubic ramus (series 2/image 246).  7 x 18 mm cutaneous lesion in the left anterior abdominal wall (series 2/image 171), max SUV 6.0, nonspecific.  IMPRESSION: Eccentric wall thickening involving the left posterolateral rectum, max SUV 21.1,  likely corresponding to known primary rectal adenocarcinoma.  No findings specific for metastatic rectal adenocarcinoma.  Status post prostatectomy with pelvic  lymph node dissection.  9 mm sclerotic lesion in the left T6 vertebral body with possible associated mild hypermetabolism, max SUV 4.6, equivocal. Metastatic prostate cancer is not excluded.  Correlate with PSA and consider nuclear medicine bone scan as clinically warranted.  7 x 18 mm cutaneous lesion in the left anterior abdominal wall, max SUV 6.0.  While this may be infectious/inflammatory, clinical correlation is required.  Consider dermatologic consultation as clinically warranted.  10 mm vague ground-glass nodule in the left upper lobe, non-FDG- avid.  3 month persistence has been demonstrated.  Follow-up CT chest is suggested in 1 year.  This recommendation follows the consensus statement: Recommendations for the Management of Subsolid Pulmonary Nodules Detected at CT:  A Statement from the Fleischner Society.  Radiology 2013; 266:304-317.   Original Report Authenticated By: Charline Bills, M.D.    Medications: I have reviewed the patient's current medications.  Assessment/Plan:  1.Rectal cancer, 1-2 centimeters above the dentate line,uT3N0, invasive adenocarcinoma confirmed on a colonoscopic biopsy 09/03/2012. He began concurrent Xeloda and radiation on 01/13/2013. 2. Prostate cancer, status post a prostatectomy in 1993, pelvic radiation in 1998 for a rising PSA, subsequent hormonal therapy and now maintained off of specific therapy with a persistently elevated PSA.  3. Left lower lobe 13 mm groundglass nodule on a CT of the chest 09/05/2012.  4. Sclerotic focus in the T6 vertebra on the chest CT 09/05/2012.  5. Elevated BUN/creatinine.  6. Rectal urgency and bleeding secondary to #1.  7. Anemia.  Disposition-Dr. Lawerance Cruel appears stable. He continues radiation and Xeloda. Overall he is tolerating treatment well. He recently began having some diarrhea. He will contact the office if the diarrhea worsens and is not controlled with Imodium.  We will obtain a followup CBC on 01/21/2013. He will  return for a followup visit on 02/14/2013 which will be about 2 weeks out from completing the course of radiation and Xeloda.  He will contact the office prior to his next visit as outlined above or with any other problems.  Plan reviewed with Dr. Truett Perna.    Lonna Cobb ANP/GNP-BC

## 2013-01-17 NOTE — Telephone Encounter (Signed)
, °

## 2013-01-20 ENCOUNTER — Ambulatory Visit
Admission: RE | Admit: 2013-01-20 | Discharge: 2013-01-20 | Disposition: A | Discharge status: Home / Self Care | Payer: Medicare Other | Source: Ambulatory Visit | Attending: Radiation Oncology | Admitting: Radiation Oncology

## 2013-01-21 ENCOUNTER — Other Ambulatory Visit (HOSPITAL_BASED_OUTPATIENT_CLINIC_OR_DEPARTMENT_OTHER): Payer: Medicare Other | Admitting: Lab

## 2013-01-21 ENCOUNTER — Ambulatory Visit
Admission: RE | Admit: 2013-01-21 | Discharge: 2013-01-21 | Disposition: A | Discharge status: Home / Self Care | Payer: Medicare Other | Source: Ambulatory Visit | Attending: Radiation Oncology | Admitting: Radiation Oncology

## 2013-01-21 DIAGNOSIS — C20 Malignant neoplasm of rectum: Secondary | ICD-10-CM

## 2013-01-21 LAB — CBC WITH DIFFERENTIAL/PLATELET
BASO%: 0.4 % (ref 0.0–2.0)
EOS%: 0.7 % (ref 0.0–7.0)
HCT: 31.4 % — ABNORMAL LOW (ref 38.4–49.9)
MCH: 33 pg (ref 27.2–33.4)
MCHC: 33.9 g/dL (ref 32.0–36.0)
MCV: 97.3 fL (ref 79.3–98.0)
MONO%: 9.2 % (ref 0.0–14.0)
NEUT%: 68.7 % (ref 39.0–75.0)
lymph#: 1.6 10*3/uL (ref 0.9–3.3)

## 2013-01-22 ENCOUNTER — Ambulatory Visit
Admission: RE | Admit: 2013-01-22 | Discharge: 2013-01-22 | Disposition: A | Discharge status: Home / Self Care | Payer: Medicare Other | Source: Ambulatory Visit | Attending: Radiation Oncology | Admitting: Radiation Oncology

## 2013-01-22 VITALS — BP 128/64 | HR 66 | Temp 97.9°F | Ht 71.0 in | Wt 220.5 lb

## 2013-01-22 DIAGNOSIS — C20 Malignant neoplasm of rectum: Secondary | ICD-10-CM

## 2013-01-22 NOTE — Progress Notes (Signed)
Tony James here for weekly under treat visit.  He has had 7 fractions to his pelvis.  He denies pain.  He has had some nausea.  He is fatigue.  He has notice that he has urinary urgency and is getting up a few times a night to urinate.  He had diarrhea last Friday and took Imodium and hasn't had any since.

## 2013-01-22 NOTE — Progress Notes (Signed)
   Department of Radiation Oncology  Phone:  [phone] Fax:        [phone]  Weekly Treatment Note    Name: Tony James Date: 01/22/2013 MRN: [id number] DOB: June 02, 1923   Current dose: 17.5 Gy  Current fraction: 7   MEDICATIONS: Current Outpatient Prescriptions  Medication Sig Dispense Refill  . aspirin 81 MG tablet Take 81 mg by mouth daily.       Marland Kitchen atenolol (TENORMIN) 50 MG tablet TAKE ONE-HALF (1/2) TABLET IN THE MORNING AND ONE TABLET IN THE EVENING  135 tablet  2  . atorvastatin (LIPITOR) 40 MG tablet Take 40 mg by mouth daily.        . capecitabine (XELODA) 500 MG tablet Take by mouth 2 (two) times daily after a meal. 2000 mg in the am and 1500 mg at night      . Cholecalciferol (VITAMIN D-1000 MAX ST) 1000 UNITS tablet Take 1,000 Units by mouth daily.      . enalapril (VASOTEC) 20 MG tablet Take 1 tablet (20 mg total) by mouth daily.  90 tablet  2  . ezetimibe (ZETIA) 10 MG tablet Take 10 mg by mouth daily.        . felodipine (PLENDIL) 10 MG 24 hr tablet TAKE 1 TABLET DAILY  30 tablet  0  . hydrochlorothiazide 25 MG tablet Take 25 mg by mouth daily. Take every other day       . loperamide (IMODIUM) 2 MG capsule Take 2 mg by mouth 4 (four) times daily as needed for diarrhea or loose stools.       No current facility-administered medications for this encounter.     ALLERGIES: Review of patient's allergies indicates no known allergies.   LABORATORY DATA:  Lab Results  Component Value Date   WBC 7.7 01/21/2013   HGB 10.6* 01/21/2013   HCT 31.4* 01/21/2013   MCV 97.3 01/21/2013   PLT 246 01/21/2013   Lab Results  Component Value Date   NA 144 12/24/2012   K 4.5 12/24/2012   CL 101 10/31/2010   CO2 23 12/24/2012   Lab Results  Component Value Date   ALT 15 12/24/2012   AST 20 12/24/2012   ALKPHOS 55 12/24/2012   BILITOT 0.43 12/24/2012     NARRATIVE: Tony James was seen today for weekly treatment management. The chart was checked and the  patient's films were reviewed. Patient is doing very well with treatment. Maybe a little bit of increased urinary urgency. The heels that he has had some improvement in terms of noticing the rectal mass. No blood  per rectum. PHYSICAL EXAMINATION: height is 5\' 11"  (1.803 m) and weight is 220 lb 8 oz (100.018 kg). His temperature is 97.9 F (36.6 C). His blood pressure is 128/64 and his pulse is 66.        ASSESSMENT: The patient is doing satisfactorily with treatment.  PLAN: We will continue with the patient's radiation treatment as planned.

## 2013-01-22 NOTE — Progress Notes (Signed)
  Radiation Oncology         [phone]) [phone] ________________________________  Name: Tony James MRN: [id number]  Date: 01/02/2013  DOB: 12-Feb-1923  SIMULATION AND TREATMENT PLANNING NOTE  DIAGNOSIS:  Rectal cancer  NARRATIVE:  The patient was brought to the CT Simulation planning suite.  Identity was confirmed.  All relevant records and images related to the planned course of therapy were reviewed.   Written consent to proceed with treatment was confirmed which was freely given after reviewing the details related to the planned course of therapy had been reviewed with the patient.  Then, the patient was set-up in a stable reproducible  supine position for radiation therapy.  CT images were obtained.  Surface markings were placed.  A customized VAC lock bag was constructed to help with the patient immobilization. This complex treatment device will be used daily.   The CT images were loaded into the planning software.  Then the target and avoidance structures were contoured.  Treatment planning then occurred.  The radiation prescription was entered and confirmed.   I have requested : Intensity Modulated Radiotherapy (IMRT) is medically necessary for this case for the following reason:  Previous treatment to this area. The target volume will be contoured in addition to normal structures including the femoral heads, bladder, bowel.  The patient will undergo daily image guidance to ensure accurate localization of the target, and adequate minimize dose to the normal surrounding structures in close proximity to the target.   PLAN:  The patient will receive 35  Gy in 14  Fractions.  Special treatment procedure The patient will receive chemotherapy during the course of radiation treatment. The patient may experience increased or overlapping toxicity due to this combined-modality approach and the patient will be monitored for such problems. This may include extra lab work as necessary. This therefore  constitutes a special treatment procedure.  ________________________________   Radene Gunning, MD, PhD

## 2013-01-23 ENCOUNTER — Ambulatory Visit
Admission: RE | Admit: 2013-01-23 | Discharge: 2013-01-23 | Disposition: A | Discharge status: Home / Self Care | Payer: Medicare Other | Source: Ambulatory Visit | Attending: Radiation Oncology | Admitting: Radiation Oncology

## 2013-01-24 ENCOUNTER — Ambulatory Visit
Admission: RE | Admit: 2013-01-24 | Discharge: 2013-01-24 | Disposition: A | Discharge status: Home / Self Care | Payer: Medicare Other | Source: Ambulatory Visit | Attending: Radiation Oncology | Admitting: Radiation Oncology

## 2013-01-27 ENCOUNTER — Ambulatory Visit
Admission: RE | Admit: 2013-01-27 | Discharge: 2013-01-27 | Disposition: A | Discharge status: Home / Self Care | Payer: Medicare Other | Source: Ambulatory Visit | Attending: Radiation Oncology | Admitting: Radiation Oncology

## 2013-01-28 ENCOUNTER — Ambulatory Visit
Admission: RE | Admit: 2013-01-28 | Discharge: 2013-01-28 | Disposition: A | Discharge status: Home / Self Care | Payer: Medicare Other | Source: Ambulatory Visit | Attending: Radiation Oncology | Admitting: Radiation Oncology

## 2013-01-28 ENCOUNTER — Encounter: Payer: Self-pay | Admitting: Radiation Oncology

## 2013-01-28 VITALS — BP 122/52 | HR 64 | Temp 98.2°F | Resp 20 | Wt 218.5 lb

## 2013-01-28 DIAGNOSIS — C20 Malignant neoplasm of rectum: Secondary | ICD-10-CM

## 2013-01-28 NOTE — Progress Notes (Signed)
   Department of Radiation Oncology  Phone:  [phone] Fax:        [phone]  Weekly Treatment Note    Name: Tony James Date: 01/28/2013 MRN: [id number] DOB: [birth year]   Current dose: 27.5 Gy  Current fraction: 11   MEDICATIONS: Current Outpatient Prescriptions  Medication Sig Dispense Refill  . aspirin 81 MG tablet Take 81 mg by mouth daily.       Marland Kitchen atenolol (TENORMIN) 50 MG tablet TAKE ONE-HALF (1/2) TABLET IN THE MORNING AND ONE TABLET IN THE EVENING  135 tablet  2  . atorvastatin (LIPITOR) 40 MG tablet Take 40 mg by mouth daily.        . capecitabine (XELODA) 500 MG tablet Take by mouth 2 (two) times daily after a meal. 2000 mg in the am and 1500 mg at night      . Cholecalciferol (VITAMIN D-1000 MAX ST) 1000 UNITS tablet Take 1,000 Units by mouth daily.      . enalapril (VASOTEC) 20 MG tablet Take 1 tablet (20 mg total) by mouth daily.  90 tablet  2  . ezetimibe (ZETIA) 10 MG tablet Take 10 mg by mouth daily.        . felodipine (PLENDIL) 10 MG 24 hr tablet TAKE 1 TABLET DAILY  30 tablet  0  . hydrochlorothiazide 25 MG tablet Take 25 mg by mouth daily. Take every other day       . loperamide (IMODIUM) 2 MG capsule Take 2 mg by mouth 4 (four) times daily as needed for diarrhea or loose stools.       No current facility-administered medications for this encounter.     ALLERGIES: Review of patient's allergies indicates no known allergies.   LABORATORY DATA:  Lab Results  Component Value Date   WBC 7.7 01/21/2013   HGB 10.6* 01/21/2013   HCT 31.4* 01/21/2013   MCV 97.3 01/21/2013   PLT 246 01/21/2013   Lab Results  Component Value Date   NA 144 12/24/2012   K 4.5 12/24/2012   CL 101 10/31/2010   CO2 23 12/24/2012   Lab Results  Component Value Date   ALT 15 12/24/2012   AST 20 12/24/2012   ALKPHOS 55 12/24/2012   BILITOT 0.43 12/24/2012     NARRATIVE: Tony James was seen today for weekly treatment management. The chart was checked and the  patient's films were reviewed. The patient is doing very well. No significant complaints. He has had some occasional diarrhea and Imodium has worked very well. He has only needed to take a couple of tablets on 2 occasions. No bleeding. He feels that his stools have become more normal.  PHYSICAL EXAMINATION: weight is 218 lb 8 oz (99.111 kg). His oral temperature is 98.2 F (36.8 C). His blood pressure is 122/52 and his pulse is 64. His respiration is 20.        ASSESSMENT: The patient is doing satisfactorily with treatment.  PLAN: We will continue with the patient's radiation treatment as planned.

## 2013-01-28 NOTE — Progress Notes (Signed)
Pt states he took Imodium this morning for diarrhea, states he has only had to take it once before. Pt reports urinary frequency, nocturia w/some dysuria. He denies loss of appetite, is fatigued.

## 2013-01-29 ENCOUNTER — Ambulatory Visit
Admission: RE | Admit: 2013-01-29 | Discharge: 2013-01-29 | Disposition: A | Discharge status: Home / Self Care | Payer: Medicare Other | Source: Ambulatory Visit | Attending: Radiation Oncology | Admitting: Radiation Oncology

## 2013-01-30 ENCOUNTER — Ambulatory Visit
Admission: RE | Admit: 2013-01-30 | Discharge: 2013-01-30 | Disposition: A | Discharge status: Home / Self Care | Payer: Medicare Other | Source: Ambulatory Visit | Attending: Radiation Oncology | Admitting: Radiation Oncology

## 2013-01-31 ENCOUNTER — Ambulatory Visit
Admission: RE | Admit: 2013-01-31 | Discharge: 2013-01-31 | Disposition: A | Discharge status: Home / Self Care | Payer: Medicare Other | Source: Ambulatory Visit | Attending: Radiation Oncology | Admitting: Radiation Oncology

## 2013-01-31 ENCOUNTER — Encounter: Payer: Self-pay | Admitting: Radiation Oncology

## 2013-02-06 ENCOUNTER — Telehealth: Payer: Self-pay | Admitting: *Deleted

## 2013-02-06 NOTE — Telephone Encounter (Signed)
Received call from pt asking to speak to Lonna Cobb NP stating that she knows him & has been dealing with him.  He reports that he is not doing well.  He reports diarrhea & complete incontinence of urine & bowel this week & fatigue which he states was expected.  He has finished radiation & xeloda.  Note to Lonna Cobb NP.  Call back # is [phone].

## 2013-02-06 NOTE — Telephone Encounter (Signed)
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 He has noted increased diarrhea as well as incontinence of both bowel and bladder since completing radiation and Xeloda on 01/31/2013. He denies any dysuria. He feels "sore and swollen". Bowel habits are unpredictable. He is taking Imodium as needed with good results. He has had 2 loose stools so far today. He reports good oral intake. He denies back pain and leg weakness/numbness.  I discussed the symptoms he is currently experiencing with both Dr. Truett Perna and Dr. Mitzi Hansen.  For now we will continue to monitor. He will contact the office if he develops any concerning symptoms such as back pain, leg weakness/numbness.  He has a followup visit with Dr. Truett Perna on 02/14/2013.

## 2013-02-14 ENCOUNTER — Telehealth: Payer: Self-pay

## 2013-02-14 ENCOUNTER — Ambulatory Visit (HOSPITAL_BASED_OUTPATIENT_CLINIC_OR_DEPARTMENT_OTHER): Payer: Medicare Other | Admitting: Oncology

## 2013-02-14 ENCOUNTER — Other Ambulatory Visit (HOSPITAL_BASED_OUTPATIENT_CLINIC_OR_DEPARTMENT_OTHER): Payer: Medicare Other | Admitting: Lab

## 2013-02-14 VITALS — BP 125/39 | HR 74 | Temp 98.0°F | Resp 19 | Ht 71.0 in | Wt 209.3 lb

## 2013-02-14 DIAGNOSIS — C20 Malignant neoplasm of rectum: Secondary | ICD-10-CM

## 2013-02-14 DIAGNOSIS — E860 Dehydration: Secondary | ICD-10-CM

## 2013-02-14 DIAGNOSIS — D649 Anemia, unspecified: Secondary | ICD-10-CM

## 2013-02-14 DIAGNOSIS — K625 Hemorrhage of anus and rectum: Secondary | ICD-10-CM

## 2013-02-14 DIAGNOSIS — R911 Solitary pulmonary nodule: Secondary | ICD-10-CM

## 2013-02-14 LAB — CBC WITH DIFFERENTIAL/PLATELET
BASO%: 0.6 % (ref 0.0–2.0)
EOS%: 2.8 % (ref 0.0–7.0)
HGB: 10.7 g/dL — ABNORMAL LOW (ref 13.0–17.1)
MCH: 33.6 pg — ABNORMAL HIGH (ref 27.2–33.4)
MCHC: 34.2 g/dL (ref 32.0–36.0)
MCV: 98.4 fL — ABNORMAL HIGH (ref 79.3–98.0)
MONO%: 12.9 % (ref 0.0–14.0)
RBC: 3.19 10*6/uL — ABNORMAL LOW (ref 4.20–5.82)
RDW: 14.5 % (ref 11.0–14.6)
lymph#: 1.1 10*3/uL (ref 0.9–3.3)

## 2013-02-14 NOTE — Telephone Encounter (Signed)
gv and printed appt schedand avs for pt for Sept °

## 2013-02-14 NOTE — Progress Notes (Signed)
   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 He completed Xeloda and radiation on 01/31/2013. He developed diarrhea for several days after the completion of therapy. This has resolved. He reports no bowel movement for the past 3 days. He complains of malaise and anorexia. He had a syncope event and fell when getting out of bed several days ago. He is concerned that he is taking too much blood pressure medication. He has noted similar symptoms in the past while on blood pressure medication. He feels dizzy when standing up.  No mouth sores, nausea, or hand/foot pain.  Objective:  Vital signs in last 24 hours:  Blood pressure 125/39, pulse 74, temperature 98 F (36.7 C), temperature source Oral, resp. rate 19, height 5\' 11"  (1.803 m), weight 209 lb 4.8 oz (94.938 kg).  Manual blood pressure lying-134/48, standing 110-120/50  HEENT: No thrush or ulcers Resp:  Lungs clear bilaterally Cardio: Regular rate and rhythm GI: No hepatomegaly, nontender Vascular: No leg edema Neuro: Alert and oriented  Skin: Diminished skin turgor at the arm , no erythema or skin breakdown at the perineum. Superficial abrasion at the left for head    Lab Results:  Lab Results  Component Value Date   WBC 6.7 02/14/2013   HGB 10.7* 02/14/2013   HCT 31.4* 02/14/2013   MCV 98.4* 02/14/2013   PLT 208 02/14/2013   ANC 4.5    Medications: I have reviewed the patient's current medications.  Assessment/Plan: 1.Rectal cancer, 1-2 centimeters above the dentate line,uT3N0, invasive adenocarcinoma confirmed on a colonoscopic biopsy 09/03/2012. He began concurrent Xeloda and radiation on 01/13/2013, completed 01/31/2013  2. Prostate cancer, status post a prostatectomy in 1993, pelvic radiation in 1998 for a rising PSA, subsequent hormonal therapy and now maintained off of specific therapy with a persistently elevated PSA.  3. Left lower lobe 13 mm groundglass  nodule on a CT of the chest 09/05/2012.  4. Sclerotic focus in the T6 vertebra on the chest CT 09/05/2012.  5. Elevated BUN/creatinine.  6. Rectal urgency and bleeding secondary to #1.  7. Anemia.   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 He developed diarrhea at the end of treatment and this has resolved. He has orthostatic symptoms today and reports a recent fall. I offered him IV fluids as he appears mildly dehydrated. He declines this and states he will push oral hydration. We decided to discontinue all of his blood pressure medication for now. He will contact us if his symptoms do not improve. He will schedule a followup upon the with Dr. Timothy Lasso. He is scheduled for a followup appointment here on 03/06/2013. We will see him sooner as needed.   [name], MD  02/14/2013  4:55 PM

## 2013-02-17 ENCOUNTER — Telehealth: Payer: Self-pay | Admitting: *Deleted

## 2013-02-17 NOTE — Telephone Encounter (Signed)
Per Dr. Truett Perna, called and spoke to pt re: orthostatic symptoms from 9/5 office visit.  Pt states he feels much better, no dizziness this w/e, and is adjusting his BP med atenolol because this am BP was high since he stopped all of his BP meds last Friday d/t dizziness when standing.  Pt states he has not r/s follow-up with Dr. Timothy Lasso yet "but I will"  States will call if any problems.

## 2013-02-22 NOTE — Progress Notes (Signed)
  Radiation Oncology         [phone] ________________________________  Name: Tony James MRN: [id number]  Date: 01/31/2013  DOB: 18-Mar-1923  End of Treatment Note  Diagnosis:   Rectal cancer     Indication for treatment:  Palliative       Radiation treatment dates:   01/13/2013 through 01/31/2013  Site/dose:   The patient was treated to the pelvis targeting the gross rectal tumor. The patient received 35 gray at 2.5 gray per fraction with concurrent chemotherapy. The patient's treatment was carried out using a IMRT technique on our tomotherapy unit.  Narrative: The patient tolerated radiation treatment relatively well.   The patient's symptoms improved in terms of decreased bleeding and some improved bowel movements. No substantial difficulties during treatment.  Plan: The patient has completed radiation treatment. The patient will return to radiation oncology clinic for routine followup in one month. I advised the patient to call or return sooner if they have any questions or concerns related to their recovery or treatment. ________________________________  Radene Gunning, M.D., Ph.D.

## 2013-03-04 ENCOUNTER — Encounter: Payer: Self-pay | Admitting: Radiation Oncology

## 2013-03-06 ENCOUNTER — Ambulatory Visit
Admission: RE | Admit: 2013-03-06 | Discharge: 2013-03-06 | Disposition: A | Discharge status: Home / Self Care | Payer: Medicare Other | Source: Ambulatory Visit | Attending: Radiation Oncology | Admitting: Radiation Oncology

## 2013-03-06 ENCOUNTER — Telehealth: Payer: Self-pay | Admitting: Oncology

## 2013-03-06 ENCOUNTER — Other Ambulatory Visit (HOSPITAL_BASED_OUTPATIENT_CLINIC_OR_DEPARTMENT_OTHER): Payer: Medicare Other | Admitting: Lab

## 2013-03-06 ENCOUNTER — Encounter: Payer: Self-pay | Admitting: Radiation Oncology

## 2013-03-06 ENCOUNTER — Ambulatory Visit (HOSPITAL_BASED_OUTPATIENT_CLINIC_OR_DEPARTMENT_OTHER): Payer: Medicare Other | Admitting: Nurse Practitioner

## 2013-03-06 VITALS — BP 143/62 | HR 83 | Temp 98.6°F | Resp 20 | Wt 222.6 lb

## 2013-03-06 VITALS — BP 135/52 | HR 73 | Temp 98.0°F | Resp 18 | Ht 71.0 in | Wt 221.9 lb

## 2013-03-06 DIAGNOSIS — C20 Malignant neoplasm of rectum: Secondary | ICD-10-CM

## 2013-03-06 HISTORY — DX: Personal history of irradiation: Z92.3

## 2013-03-06 LAB — BASIC METABOLIC PANEL (CC13)
BUN: 17.6 mg/dL (ref 7.0–26.0)
Chloride: 111 mEq/L — ABNORMAL HIGH (ref 98–109)
Glucose: 102 mg/dl (ref 70–140)
Potassium: 4.4 mEq/L (ref 3.5–5.1)
Sodium: 143 mEq/L (ref 136–145)

## 2013-03-06 LAB — CBC WITH DIFFERENTIAL/PLATELET
Basophils Absolute: 0 10*3/uL (ref 0.0–0.1)
Eosinophils Absolute: 0.1 10*3/uL (ref 0.0–0.5)
HGB: 9.9 g/dL — ABNORMAL LOW (ref 13.0–17.1)
MONO%: 14.7 % — ABNORMAL HIGH (ref 0.0–14.0)
NEUT#: 3.5 10*3/uL (ref 1.5–6.5)
RBC: 2.91 10*6/uL — ABNORMAL LOW (ref 4.20–5.82)
RDW: 17.5 % — ABNORMAL HIGH (ref 11.0–14.6)
WBC: 5.7 10*3/uL (ref 4.0–10.3)
lymph#: 1.3 10*3/uL (ref 0.9–3.3)

## 2013-03-06 NOTE — Progress Notes (Signed)
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 He is feeling better. Blood pressure medications have been adjusted. He has had no more syncopal events. He has intermittent mild lightheadedness. No falls. The rectal pain/pressure present prior to treatment has resolved. He intermittently notes pink tinged mucous from the rectum. No frank bleeding. He continues to have periodic fecal incontinence. He is no longer experiencing urinary incontinence. He has a good appetite. He continues to feel "weak" though notes this is improving. He denies shortness of breath and chest pain.  He reports recent PSA returned at 9 as compared to a value of 5 in March of this year. He is followed by Dr. Earlene Plater.   Objective: Blood pressure 135/52, pulse 73, temperature 98 F (36.7 C), temperature source Oral, resp. rate 18, height 5\' 11"  (1.803 m), weight 221 lb 14.4 oz (100.653 kg).  Oropharynx is without thrush or ulceration. No palpable cervical or supraclavicular lymph nodes. Lungs are clear. No wheezes or rales. Regular cardiac rhythm. Abdomen is soft and nontender. No hepatomegaly. Trace lower leg edema bilaterally. He ambulates with a cane. Mild erythema at the perineum. No skin breakdown. Alert and oriented.  Lab Results: Lab Results  Component Value Date   WBC 5.7 03/06/2013   HGB 9.9* 03/06/2013   HCT 29.5* 03/06/2013   MCV 101.3* 03/06/2013   PLT 228 03/06/2013    Chemistry:    Chemistry      Component Value Date/Time   NA 143 03/06/2013 1359   NA 138 10/31/2010 1110   K 4.4 03/06/2013 1359   K 4.7 10/31/2010 1110   CL 101 10/31/2010 1110   CO2 24 03/06/2013 1359   CO2 27 10/31/2010 1110   BUN 17.6 03/06/2013 1359   BUN 33* 10/31/2010 1110   CREATININE 1.3 03/06/2013 1359   CREATININE 1.4 10/31/2010 1110      Component Value Date/Time   CALCIUM 9.4 03/06/2013 1359   CALCIUM 10.1 10/31/2010 1110   ALKPHOS 55 12/24/2012 1155   ALKPHOS 41 10/31/2010 1110   AST 20  12/24/2012 1155   AST 25 10/31/2010 1110   ALT 15 12/24/2012 1155   ALT 15 10/31/2010 1110   BILITOT 0.43 12/24/2012 1155   BILITOT 0.7 10/31/2010 1110       Studies/Results: No results found.  Medications: I have reviewed the patient's current medications.  Assessment/Plan:  1.Rectal cancer, 1-2 centimeters above the dentate line,uT3N0, invasive adenocarcinoma confirmed on a colonoscopic biopsy 09/03/2012. He began concurrent Xeloda and radiation on 01/13/2013, completed 01/31/2013.  2. Prostate cancer, status post a prostatectomy in 1993, pelvic radiation in 1998 for a rising PSA, subsequent hormonal therapy and now maintained off of specific therapy with a persistently elevated PSA. He is followed by Dr. Earlene Plater. 3. Left lower lobe 13 mm groundglass nodule on a CT of the chest 09/05/2012.  4. Sclerotic focus in the T6 vertebra on the chest CT 09/05/2012.  5. Elevated BUN/creatinine.  improved on labs today. 6. Rectal urgency and bleeding secondary to #1. Improved. 7. Anemia.  Disposition-he appears improved. He will return for a followup visit in 2 months. He will contact the office in the interim with any problems.  Plan reviewed with Dr. Truett Perna.  Lonna Cobb ANP/GNP-BC

## 2013-03-06 NOTE — Progress Notes (Addendum)
Follow up rad tx rectal 01/13/13-01/31/13, has bm 1-2x week, wears depends, has incontinence still of stool, no diarrhea, no dysuria, no c/o pain or  N/v, appetite good, energy level low/fatigued, biggest c/o states patient, has f/u appt today in med/onc, , 96% room air sats, ambulating with cane steady gait, saw Dr. Darvin Neighbours  III on Monday, his PSA increased to 9 doubled from March, will return to him in 3 weeks,   1:16 PM

## 2013-03-06 NOTE — Telephone Encounter (Signed)
gv and printed appt sched and avs forpt for NOV...d.t. per LT

## 2013-03-06 NOTE — Progress Notes (Signed)
  Radiation Oncology         [phone]) [phone] ________________________________  Name: Tony James MRN: [id number]  Date: 03/06/2013  DOB: [birth year]  Follow-Up Visit Note  CC: Gwen Pounds, MD  Ladene Artist, MD  Diagnosis:   Rectal cancer  Interval Since Last Radiation:  One month   Narrative:  The patient returns today for routine follow-up.  The patient is doing well at this time. His primary complaint is some decreased energy. He notes diarrhea maybe a couple of times a week.  Most of the time however he has regular stools. He does note some occasional incontinence of stool on a fairly regular basis. He does believe that this has been improving however over the last couple of weeks. No nausea and vomiting. No bladder issues. Occasional red tinged mucus/secretion per rectum without any significant blood per rectum currently.                             ALLERGIES:  has No Known Allergies.  Meds: Current Outpatient Prescriptions  Medication Sig Dispense Refill  . atenolol (TENORMIN) 50 MG tablet Take 25 mg by mouth at bedtime. 25 mg.      . felodipine (PLENDIL) 10 MG 24 hr tablet Take 5 mg by mouth at bedtime. 5 mg.      . atorvastatin (LIPITOR) 40 MG tablet Take 40 mg by mouth daily.        . Cholecalciferol (VITAMIN D-1000 MAX ST) 1000 UNITS tablet Take 1,000 Units by mouth daily.      . enalapril (VASOTEC) 20 MG tablet Take 1 tablet (20 mg total) by mouth daily.  90 tablet  2  . ezetimibe (ZETIA) 10 MG tablet Take 10 mg by mouth daily.        . hydrochlorothiazide 25 MG tablet Take 25 mg by mouth daily. Take every other day       . loperamide (IMODIUM) 2 MG capsule Take 2 mg by mouth 4 (four) times daily as needed for diarrhea or loose stools.       No current facility-administered medications for this encounter.    Physical Findings: The patient is in no acute distress. Patient is alert and oriented.  weight is 222 lb 9.6 oz (100.971 kg). His oral temperature is 98.6 F  (37 C). His blood pressure is 143/62 and his pulse is 83. His respiration is 20 and oxygen saturation is 96%. .     Lab Findings: Lab Results  Component Value Date   WBC 6.7 02/14/2013   HGB 10.7* 02/14/2013   HCT 31.4* 02/14/2013   MCV 98.4* 02/14/2013   PLT 208 02/14/2013     Radiographic Findings: No results found.  Impression:    The patient is doing satisfactorily believe 1 month after completing his treatment. This was a course of reirradiation which was a higher risk procedure please with how he is doing. As we discussed, I am hopeful that the incontinence of stool issue will improve and the patient does feel that this has been occurring. This only began after treatment which would be a more hopeful time course.  Plan:  The patient is seeing medical oncology later today. I am going to see the patient back in clinic in 4 months.   Radene Gunning, M.D., Ph.D.

## 2013-04-17 ENCOUNTER — Other Ambulatory Visit: Payer: Self-pay

## 2013-05-06 ENCOUNTER — Telehealth: Payer: Self-pay | Admitting: Oncology

## 2013-05-06 ENCOUNTER — Ambulatory Visit (HOSPITAL_BASED_OUTPATIENT_CLINIC_OR_DEPARTMENT_OTHER): Payer: Medicare Other | Admitting: Oncology

## 2013-05-06 ENCOUNTER — Other Ambulatory Visit (HOSPITAL_BASED_OUTPATIENT_CLINIC_OR_DEPARTMENT_OTHER): Payer: Medicare Other | Admitting: Lab

## 2013-05-06 VITALS — BP 152/65 | HR 79 | Temp 97.3°F | Resp 20 | Ht 71.0 in | Wt 224.1 lb

## 2013-05-06 DIAGNOSIS — Z8546 Personal history of malignant neoplasm of prostate: Secondary | ICD-10-CM

## 2013-05-06 DIAGNOSIS — M899 Disorder of bone, unspecified: Secondary | ICD-10-CM

## 2013-05-06 DIAGNOSIS — C20 Malignant neoplasm of rectum: Secondary | ICD-10-CM

## 2013-05-06 DIAGNOSIS — R911 Solitary pulmonary nodule: Secondary | ICD-10-CM

## 2013-05-06 DIAGNOSIS — D649 Anemia, unspecified: Secondary | ICD-10-CM

## 2013-05-06 LAB — CBC WITH DIFFERENTIAL/PLATELET
Eosinophils Absolute: 0.1 10*3/uL (ref 0.0–0.5)
HCT: 34.6 % — ABNORMAL LOW (ref 38.4–49.9)
LYMPH%: 30.1 % (ref 14.0–49.0)
MCHC: 33 g/dL (ref 32.0–36.0)
MCV: 100.8 fL — ABNORMAL HIGH (ref 79.3–98.0)
MONO#: 1 10*3/uL — ABNORMAL HIGH (ref 0.1–0.9)
MONO%: 13.6 % (ref 0.0–14.0)
NEUT#: 3.9 10*3/uL (ref 1.5–6.5)
NEUT%: 54.8 % (ref 39.0–75.0)
Platelets: 238 10*3/uL (ref 140–400)
RBC: 3.43 10*6/uL — ABNORMAL LOW (ref 4.20–5.82)
WBC: 7.2 10*3/uL (ref 4.0–10.3)

## 2013-05-06 NOTE — Progress Notes (Signed)
   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 He feels well. He reports a normal daily bowel movement. No urgency or pain. He has noted increased bleeding after bowel movements. He developed ankle swelling when he discontinued all of the blood pressure medications. This improved when he resumed hydrochlorothiazide. He continues to adjust the blood pressure medicine. No further syncope events.  Objective:  Vital signs in last 24 hours:  Blood pressure 152/65, pulse 79, temperature 97.3 F (36.3 C), temperature source Oral, resp. rate 20, height 5\' 11"  (1.803 m), weight 224 lb 1.6 oz (101.651 kg).    HEENT: Neck without mass Lymphatics: No cervical, supraclavicular, axillary, or inguinal nodes Resp: Lungs clear bilaterally Cardio: Regular rate and rhythm GI: No hepatomegaly, nontender, no mass Vascular: Trace low leg edema bilaterally Rectal: There is a mass at the tip of the examination finger that is partially circumferential, chiefly over the posterior rectum.    Portacath/PICC-without erythema  Lab Results:  Lab Results  Component Value Date   WBC 7.2 05/06/2013   HGB 11.4* 05/06/2013   HCT 34.6* 05/06/2013   MCV 100.8* 05/06/2013   PLT 238 05/06/2013   ANC 3.9    Medications: I have reviewed the patient's current medications.  Assessment/Plan: 1.Rectal cancer, 1-2 centimeters above the dentate line,uT3N0, invasive adenocarcinoma confirmed on a colonoscopic biopsy 09/03/2012. He began concurrent Xeloda and radiation on 01/13/2013, completed 01/31/2013.  2. Prostate cancer, status post a prostatectomy in 1993, pelvic radiation in 1998 for a rising PSA, subsequent hormonal therapy and now maintained off of specific therapy with a persistently elevated PSA. He is followed by Dr. Earlene Plater.  3. Left lower lobe 13 mm groundglass nodule on a CT of the chest 09/05/2012.  4. Sclerotic focus in  the T6 vertebra on the chest CT 09/05/2012.  5. Rectal urgency and bleeding secondary to #1. The urgency has improved. He has noted increased bleeding over the past month. 6. Anemia. Improved.  Disposition:  The rectal symptoms improved following Xeloda/radiation. There is a persistent mass on exam today. I suspect the bleeding is related to the rectal mass. We discussed systemic chemotherapy options. We decided to continue observation for now. Dr. Lawerance Cruel will contact us if he develops obstructive symptoms, pain, or increased bleeding. We will consider single agent Xeloda or oxaliplatin-based therapy when he develops clinical progression. He will return for an office visit in 3 months. He will contact us sooner as needed.   [name], MD  05/06/2013  12:09 PM

## 2013-05-06 NOTE — Telephone Encounter (Signed)
appt made per 11/25 POF AVS and CAL given shh

## 2013-05-06 NOTE — Progress Notes (Signed)
Reports fecal incontinence has resolved and he is no longer light headed.

## 2013-07-03 ENCOUNTER — Ambulatory Visit
Admission: RE | Admit: 2013-07-03 | Discharge: 2013-07-03 | Disposition: A | Discharge status: Home / Self Care | Payer: Medicare Other | Source: Ambulatory Visit | Attending: Radiation Oncology | Admitting: Radiation Oncology

## 2013-07-03 ENCOUNTER — Encounter: Payer: Self-pay | Admitting: Radiation Oncology

## 2013-07-03 VITALS — BP 167/61 | HR 71 | Temp 97.8°F | Resp 20 | Ht 71.0 in | Wt 226.2 lb

## 2013-07-03 DIAGNOSIS — C20 Malignant neoplasm of rectum: Secondary | ICD-10-CM

## 2013-07-03 NOTE — Progress Notes (Signed)
Follow up rad tx s rectal 01/23/13-01/31/13 pelvic, still having bleeding with every bowel movements says 2=3cc , regular bowel movements, great appetite, fatigued still, takes aleve bid for knee pain 10:01 AM

## 2013-07-03 NOTE — Progress Notes (Signed)
Radiation Oncology         [phone]) [phone] ________________________________  Name: Tony James MRN: [id number]  Date: 07/03/2013  DOB: [birth year]  Follow-Up Visit Note  CC: Precious Reel, MD  Ladell Pier, MD  Diagnosis:   Rectal cancer  Interval Since Last Radiation:  5 months   Narrative:  The patient returns today for routine follow-up.  The patient states that he has done well overall since he was last seen. The patient continues to experience a significant improvement in bowel function as compared to prior to radiation treatment. His bowel movements are quite regular at this time and he does have an improved appetite. He does notice some bloody mucous discharge on a regular basis. The patient has been on observation although he has discussed possible additional chemotherapy with Dr. Benay Spice. He is scheduled to be seen in medical oncology next month.  The patient also indicates that his PSA has been rising at an increasing rate. He is in the process of having his testosterone level checked by urology and restarting hormonal treatment is being considered.                              ALLERGIES:  has No Known Allergies.  Meds: Current Outpatient Prescriptions  Medication Sig Dispense Refill  . aspirin 81 MG tablet Take 81 mg by mouth daily.      Marland Kitchen atenolol (TENORMIN) 50 MG tablet Take 25 mg by mouth at bedtime. 25 mg.      . Cholecalciferol (VITAMIN D-1000 MAX ST) 1000 UNITS tablet Take 1,000 Units by mouth daily.      . felodipine (PLENDIL) 10 MG 24 hr tablet Take 5 mg by mouth at bedtime. 5 mg.      . hydrochlorothiazide 25 MG tablet Take 25 mg by mouth daily. Take every other day       . naproxen sodium (ANAPROX) 220 MG tablet Take 220 mg by mouth 2 (two) times daily with a meal.      . enalapril (VASOTEC) 20 MG tablet Take 1 tablet (20 mg total) by mouth daily.  90 tablet  2  . loperamide (IMODIUM) 2 MG capsule Take 2 mg by mouth 4 (four) times daily as needed for  diarrhea or loose stools.       No current facility-administered medications for this encounter.    Physical Findings: The patient is in no acute distress. Patient is alert and oriented.  height is 5\' 11"  (1.803 m) and weight is 226 lb 3.2 oz (102.604 kg). His oral temperature is 97.8 F (36.6 C). His blood pressure is 167/61 and his pulse is 71. His respiration is 20. Marland Kitchen   General: Well-developed, in no acute distress HEENT: Normocephalic, atraumatic Cardiovascular: Regular rate and rhythm Respiratory: Clear to auscultation bilaterally GI: Soft, nontender, normal bowel sounds Extremities: No edema present Rectal: Palpable tumor present on digital rectal exam. Small amount of blood on exam glove.  Lab Findings: Lab Results  Component Value Date   WBC 7.2 05/06/2013   HGB 11.4* 05/06/2013   HCT 34.6* 05/06/2013   MCV 100.8* 05/06/2013   PLT 238 05/06/2013     Radiographic Findings: No results found.  Impression:    The patient clinically has done fairly well since his palliative course of radiation treatment. Bowel movements have been fairly normal although he does continue to have some continued rectal bleeding. This appears to be manageable at this  time. Followup scheduled with medical oncology next month and further management/workup for prostate cancer through urology.  Plan:  The patient will return to clinic in 6 months. I would be more than happy to see the patient sooner if deemed appropriate.   Jodelle Gross, M.D., Ph.D.

## 2013-07-29 ENCOUNTER — Telehealth: Payer: Self-pay | Admitting: Oncology

## 2013-07-29 ENCOUNTER — Ambulatory Visit: Payer: Medicare Other | Admitting: Oncology

## 2013-07-29 NOTE — Telephone Encounter (Signed)
Talked to pt and email Dr Benay Spice , asking for spot , waiting for answer

## 2013-07-30 ENCOUNTER — Telehealth: Payer: Self-pay | Admitting: Oncology

## 2013-07-30 NOTE — Telephone Encounter (Signed)
Talked to pt and gav him appt for 2/20

## 2013-08-01 ENCOUNTER — Telehealth: Payer: Self-pay | Admitting: Oncology

## 2013-08-01 ENCOUNTER — Ambulatory Visit (HOSPITAL_BASED_OUTPATIENT_CLINIC_OR_DEPARTMENT_OTHER): Payer: Medicare Other | Admitting: Oncology

## 2013-08-01 VITALS — BP 153/60 | HR 72 | Temp 97.1°F | Resp 18 | Ht 71.0 in | Wt 228.3 lb

## 2013-08-01 DIAGNOSIS — Z8546 Personal history of malignant neoplasm of prostate: Secondary | ICD-10-CM

## 2013-08-01 DIAGNOSIS — D6481 Anemia due to antineoplastic chemotherapy: Secondary | ICD-10-CM

## 2013-08-01 DIAGNOSIS — C20 Malignant neoplasm of rectum: Secondary | ICD-10-CM

## 2013-08-01 DIAGNOSIS — D630 Anemia in neoplastic disease: Secondary | ICD-10-CM

## 2013-08-01 DIAGNOSIS — T451X5A Adverse effect of antineoplastic and immunosuppressive drugs, initial encounter: Secondary | ICD-10-CM

## 2013-08-01 NOTE — Telephone Encounter (Signed)
gv and printed appt sched and avs for pt for May °

## 2013-08-01 NOTE — Progress Notes (Signed)
   Tony James    OFFICE PROGRESS NOTE   INTERVAL HISTORY:   Tony James returns for scheduled followup of rectal cancer. He feels well. Normal bowel movements. No pain. He has bleeding with bowel movements. He also passes small amounts of blood at other times, especially with walking. He is followed by Tony James for a rising PSA.  Objective:  Vital signs in last 24 hours:  Blood pressure 153/60, pulse 72, temperature 97.1 F (36.2 C), temperature source Oral, resp. rate 18, height 5\' 11"  (1.803 m), weight 228 lb 4.8 oz (103.556 kg), SpO2 100.00%.    HEENT: Neck without mass Lymphatics: No cervical, supraclavicular, axillary, or inguinal nodes Resp: Lungs clear bilaterally Cardio: Regular rate and rhythm GI: No hepatosplenomegaly Vascular: No leg edema Rectal: There is firm nodular tumor centered at the left anterior rectum extending along the lateral left rectum. There is blood on the examination glove.    Lab Results:  Lab Results  Component Value Date   WBC 7.2 05/06/2013   HGB 11.4* 05/06/2013   HCT 34.6* 05/06/2013   MCV 100.8* 05/06/2013   PLT 238 05/06/2013   NEUTROABS 3.9 05/06/2013   PSA on 07/14/2013-17.68     Medications: I have reviewed the patient's current medications.  Assessment/Plan: 1.Rectal cancer, 1-2 centimeters above the dentate line,uT3N0, invasive adenocarcinoma confirmed on a colonoscopic biopsy 09/03/2012. He began concurrent Xeloda and radiation on 01/13/2013, completed 01/31/2013.  2. Prostate cancer, status post a prostatectomy in 1993, pelvic radiation in 1998 for a rising PSA, subsequent hormonal therapy and now maintained off of specific therapy with a persistently elevated PSA. He is followed by Tony James.  3. Left lower lobe 13 mm groundglass nodule on a CT of the chest 09/05/2012.  4. Sclerotic focus in the T6 vertebra on the chest CT 09/05/2012.  5. Rectal urgency and bleeding secondary to #1. The urgency has  improved. He continues to have intermittent rectal bleeding 6. Anemia secondary to rectal bleeding, prostate cancer, and chemotherapy/radiation-he reports the hemoglobin was greater than 11 when recently checked by Dr. Virgina Jock  Disposition:  Tony James remains asymptomatic from the rectal cancer aside from intermittent low volume bleeding. We discussed continued observation versus a trial of systemic chemotherapy. He would like to continue observation for now. He will contact me for increased bleeding or new symptoms. Tony James will return for an office visit in 3 months.  He will see Tony James within the next few months for a repeat PSA.   Betsy Coder, MD  08/01/2013  9:22 AM

## 2013-10-24 ENCOUNTER — Ambulatory Visit (HOSPITAL_BASED_OUTPATIENT_CLINIC_OR_DEPARTMENT_OTHER): Payer: Medicare Other

## 2013-10-24 ENCOUNTER — Ambulatory Visit (HOSPITAL_BASED_OUTPATIENT_CLINIC_OR_DEPARTMENT_OTHER): Payer: Medicare Other | Admitting: Oncology

## 2013-10-24 ENCOUNTER — Telehealth: Payer: Self-pay | Admitting: Oncology

## 2013-10-24 VITALS — BP 153/52 | HR 71 | Temp 98.3°F | Resp 19 | Ht 71.0 in | Wt 229.0 lb

## 2013-10-24 DIAGNOSIS — R972 Elevated prostate specific antigen [PSA]: Secondary | ICD-10-CM

## 2013-10-24 DIAGNOSIS — D500 Iron deficiency anemia secondary to blood loss (chronic): Secondary | ICD-10-CM

## 2013-10-24 DIAGNOSIS — D6481 Anemia due to antineoplastic chemotherapy: Secondary | ICD-10-CM

## 2013-10-24 DIAGNOSIS — C20 Malignant neoplasm of rectum: Secondary | ICD-10-CM

## 2013-10-24 DIAGNOSIS — C61 Malignant neoplasm of prostate: Secondary | ICD-10-CM

## 2013-10-24 DIAGNOSIS — K625 Hemorrhage of anus and rectum: Secondary | ICD-10-CM

## 2013-10-24 DIAGNOSIS — D649 Anemia, unspecified: Secondary | ICD-10-CM

## 2013-10-24 DIAGNOSIS — T451X5A Adverse effect of antineoplastic and immunosuppressive drugs, initial encounter: Secondary | ICD-10-CM

## 2013-10-24 LAB — CBC WITH DIFFERENTIAL/PLATELET
BASO%: 0.6 % (ref 0.0–2.0)
BASOS ABS: 0 10*3/uL (ref 0.0–0.1)
EOS ABS: 0.1 10*3/uL (ref 0.0–0.5)
EOS%: 1.5 % (ref 0.0–7.0)
HCT: 33.1 % — ABNORMAL LOW (ref 38.4–49.9)
HEMOGLOBIN: 10.9 g/dL — AB (ref 13.0–17.1)
LYMPH#: 1.6 10*3/uL (ref 0.9–3.3)
LYMPH%: 24.3 % (ref 14.0–49.0)
MCH: 32.3 pg (ref 27.2–33.4)
MCHC: 32.8 g/dL (ref 32.0–36.0)
MCV: 98.4 fL — AB (ref 79.3–98.0)
MONO#: 0.9 10*3/uL (ref 0.1–0.9)
MONO%: 14.5 % — ABNORMAL HIGH (ref 0.0–14.0)
NEUT%: 59.1 % (ref 39.0–75.0)
NEUTROS ABS: 3.8 10*3/uL (ref 1.5–6.5)
Platelets: 248 10*3/uL (ref 140–400)
RBC: 3.37 10*6/uL — ABNORMAL LOW (ref 4.20–5.82)
RDW: 13.4 % (ref 11.0–14.6)
WBC: 6.4 10*3/uL (ref 4.0–10.3)

## 2013-10-24 NOTE — Telephone Encounter (Signed)
gave pt appt for september,sent pt to labs today

## 2013-10-24 NOTE — Progress Notes (Signed)
**Tony Tony**   Tony Tony   Diagnosis: Rectal cancer  INTERVAL HISTORY:   Tony Tony returns as scheduled. He feels well. He continues to have intermittent rectal bleeding-unchanged. No constipation or diarrhea. No pain. Good appetite. No "dizzy "episodes.  He saw Dr. Rosana Hoes last week and reports the PSA was elevated to 28.  Objective:  Vital signs in last 24 hours:  Blood pressure 153/52, pulse 71, temperature 98.3 F (36.8 C), temperature source Oral, resp. rate 19, height 5\' 11"  (1.803 m), weight 229 lb (103.874 kg), SpO2 98.00%.    Lymphatics: No cervical, supraclavicular, axillary, or inguinal nodes Resp: Lungs clear bilaterally Cardio: Regular rate and rhythm GI: No hepatomegaly , nontender, no mass Vascular: No leg edema   Lab Results:  Lab Results  Component Value Date   WBC 6.4 10/24/2013   HGB 10.9* 10/24/2013   HCT 33.1* 10/24/2013   MCV 98.4* 10/24/2013   PLT 248 10/24/2013   NEUTROABS 3.8 10/24/2013     Imaging:  No results found.  Medications: I have reviewed the patient's current medications.  Assessment/Plan: 1.Rectal cancer, 1-2 centimeters above the dentate line,uT3N0, invasive adenocarcinoma confirmed on a colonoscopic biopsy 09/03/2012. He began concurrent Xeloda and radiation on 01/13/2013, completed 01/31/2013.  2. Prostate cancer, status post a prostatectomy in 1993, pelvic radiation in 1998 for a rising PSA, subsequent hormonal therapy and now maintained off of specific therapy with a persistently elevated PSA. He is followed by Dr. Rosana Hoes.  3. Left lower lobe 13 mm groundglass nodule on a CT of the chest 09/05/2012.  4. Sclerotic focus in the T6 vertebra on the chest CT 09/05/2012.  5. Rectal urgency and bleeding secondary to #1. The urgency has improved. He continues to have intermittent rectal bleeding  6. Anemia secondary to rectal bleeding, prostate cancer, and chemotherapy/radiation   Disposition:  Tony Tony appears  stable. He continues to have mild rectal bleeding, but is otherwise asymptomatic from the rectal cancer. We decided to continue an observation approach.  He has a rising PSA and remains asymptomatic from prostate cancer. We discussed systemic treatment options including resumption of Lupron, Casodex, and Zytiga. We also discussed a restaging bone scan.  He is comfortable with continued observation. We will check a bone scan and initiate therapy if he develops new symptoms.  Tony Tony will return for an office visit in 4 months. He will contact us in the interim for new symptoms.  Tony Pier, MD  10/24/2013  2:22 PM

## 2013-12-18 ENCOUNTER — Encounter: Payer: Self-pay | Admitting: Radiation Oncology

## 2013-12-18 ENCOUNTER — Ambulatory Visit
Admission: RE | Admit: 2013-12-18 | Discharge: 2013-12-18 | Disposition: A | Discharge status: Home / Self Care | Payer: Medicare Other | Source: Ambulatory Visit | Attending: Radiation Oncology | Admitting: Radiation Oncology

## 2013-12-18 VITALS — BP 166/72 | HR 72 | Temp 98.4°F | Resp 20 | Ht 71.0 in | Wt 233.6 lb

## 2013-12-18 DIAGNOSIS — C20 Malignant neoplasm of rectum: Secondary | ICD-10-CM

## 2013-12-18 NOTE — Progress Notes (Signed)
  Radiation Oncology         [phone]) [phone] ________________________________  Name: Tu Bayle MRN: [id number]  Date: 12/18/2013  DOB: 29-Oct-1922  Follow-Up Visit Note  CC: Precious Reel, MD  Ladell Pier, MD  Diagnosis:   Rectal cancer  Interval Since Last Radiation:  Approximately 11 months   Narrative:  The patient returns today for routine follow-up.  He states that he is doing well in terms of his current quality of life. He is very pleased this. He is doing better than expected although he continues to have some bleeding with discharge. This has in general been stable. 80 little increased although he attributes this possibly to increased activity as he is moving within the next couple of weeks. He has therefore been active in this move. The patient denies any pain in the pelvic region. He denies any difficulty with obstruction or constipation.                              ALLERGIES:  has No Known Allergies.  Meds: Current Outpatient Prescriptions  Medication Sig Dispense Refill  . aspirin 81 MG tablet Take 81 mg by mouth daily.      Marland Kitchen atenolol (TENORMIN) 50 MG tablet Take 25 mg by mouth at bedtime. 25 mg.      . Cholecalciferol (VITAMIN D-1000 MAX ST) 1000 UNITS tablet Take 1,000 Units by mouth daily.      . enalapril (VASOTEC) 20 MG tablet Take 1 tablet (20 mg total) by mouth daily.  90 tablet  2  . felodipine (PLENDIL) 10 MG 24 hr tablet Take 5 mg by mouth at bedtime. 5 mg.      . hydrochlorothiazide 25 MG tablet Take 25 mg by mouth daily.       . naproxen sodium (ANAPROX) 220 MG tablet Take 220 mg by mouth 2 (two) times daily with a meal.       No current facility-administered medications for this encounter.    Physical Findings: The patient is in no acute distress. Patient is alert and oriented.  height is 5\' 11"  (1.803 m) and weight is 233 lb 9.6 oz (105.96 kg). His oral temperature is 98.4 F (36.9 C). His blood pressure is 166/72 and his pulse is 72. His  respiration is 20. Marland Kitchen   Rectal exam: Some discharge is present Applied. Palpable tumor is present beginning 2-3 cm from the anal verge. This is partially circumferential at the distal end without obstruction.  Lab Findings: Lab Results  Component Value Date   WBC 6.4 10/24/2013   HGB 10.9* 10/24/2013   HCT 33.1* 10/24/2013   MCV 98.4* 10/24/2013   PLT 248 10/24/2013     Radiographic Findings: No results found.  Impression:    The patient clinically is doing well and I am very pleased that he has maintained his quality of life. No significant new complaints today. The patient is continuing followup with Dr. Benay Spice and medical oncology and he also is seen by urology with regards to his diagnosis of prostate cancer. We discussed possible followup options.  Plan:  The patient decided that he would like to followup with me on a when necessary basis. The patient was encouraged to contact our office if we can be of any further assistance, either for his rectal cancer or for prostate cancer.   Jodelle Gross, M.D., Ph.D.

## 2013-12-18 NOTE — Progress Notes (Signed)
Follow up s/p rad txs  Rectal 01/13/13-01/31/13 patient doing "better than I expected" stated patient, little more straining with BM'S, but formed and going daily, no bladder issues, very fatigued,  Main c/o, no exercising, appetite good 3:45 PM

## 2014-01-27 ENCOUNTER — Telehealth: Payer: Self-pay | Admitting: *Deleted

## 2014-01-27 NOTE — Telephone Encounter (Signed)
Per Dr. Benay Spice: pt needs to be seen within next 2 weeks. POF to schedulers for appt.

## 2014-01-28 IMAGING — CT CT CHEST W/ CM
4 of 5 series · 12 of 46 positions shown, 18 images · IV contrast (READICAT/WATER & [ID] OMNI 300)
Comparison: None.

CT CHEST

CLINICAL DATA: Prostate cancer.

CT CHEST, ABDOMEN AND PELVIS WITH CONTRAST
TECHNIQUE: Multidetector CT imaging of the chest, abdomen and
pelvis was performed following the standard protocol during bolus
administration of intravenous contrast.
Contrast: 125mL OMNIPAQUE IOHEXOL 300 MG/ML  SOLN,

[Series 2: chest/abd/pelvis · axial · 0.85mm/px · z∈[-556,-246]mm · 5 of 132 slices shown]
[im 14/132  soft-tissue]
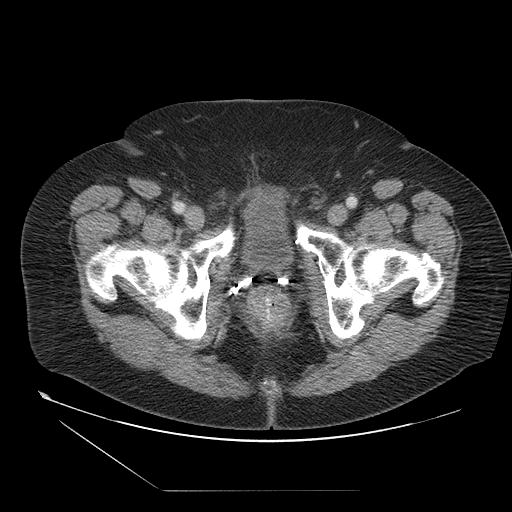
[im 28/132  soft-tissue]
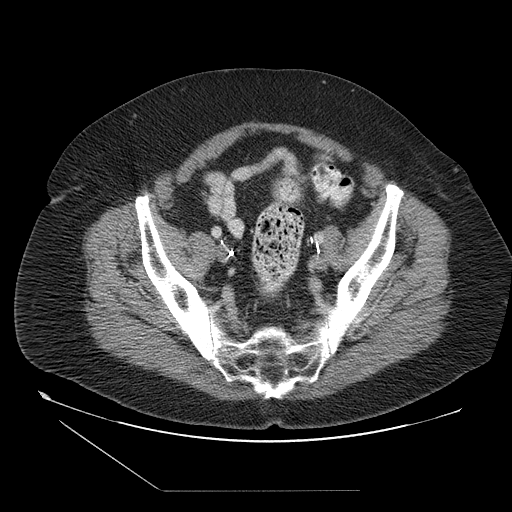
[im 42/132  soft-tissue]
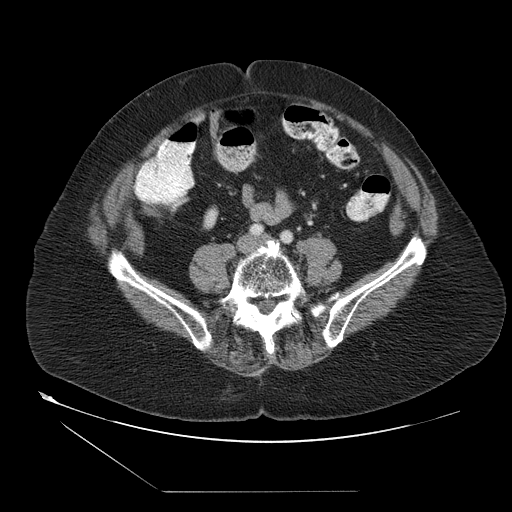
[im 56/132  soft-tissue]
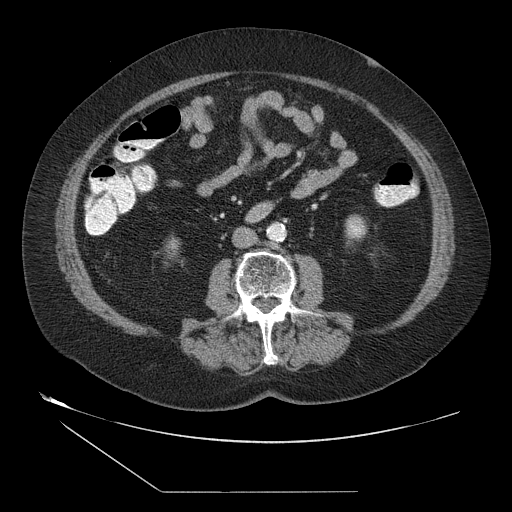
[im 76/132  soft-tissue]
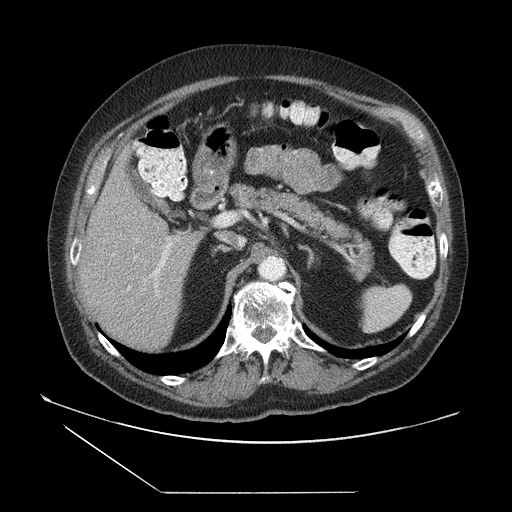

[Series 5: renal delays · axial · 0.89mm/px · z∈[-356,-246]mm · 3 of 23 slices shown, 7 images]
[im 1/23  soft-tissue]
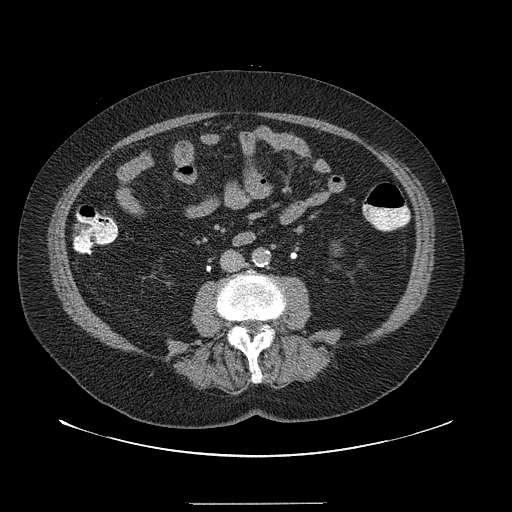
[im 1/23  lung]
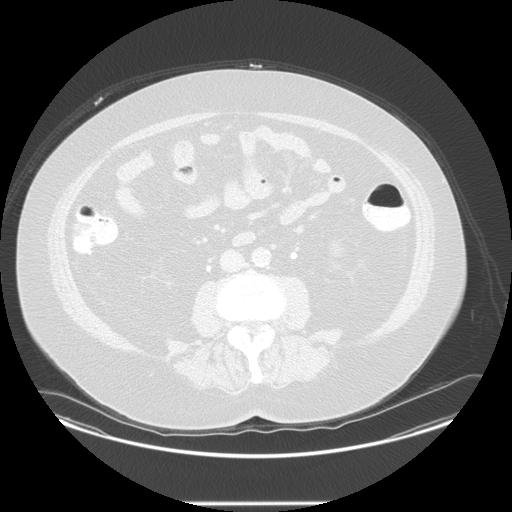
[im 1/23  bone]
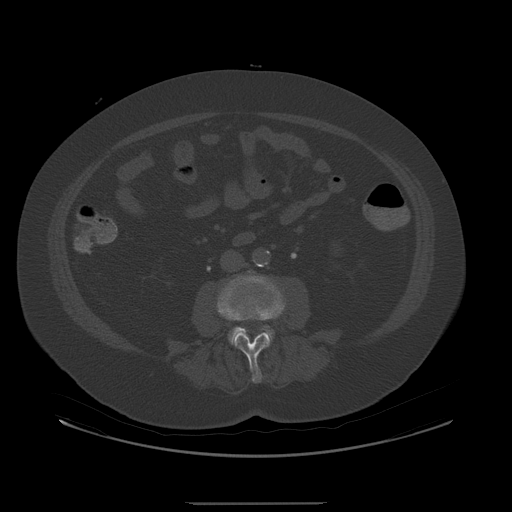
[im 12/23  soft-tissue]
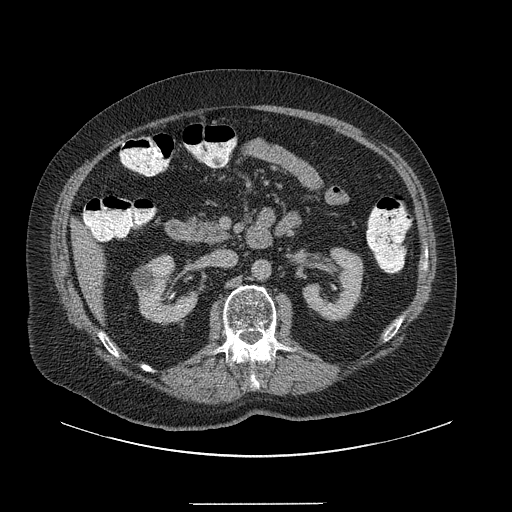
[im 12/23  lung]
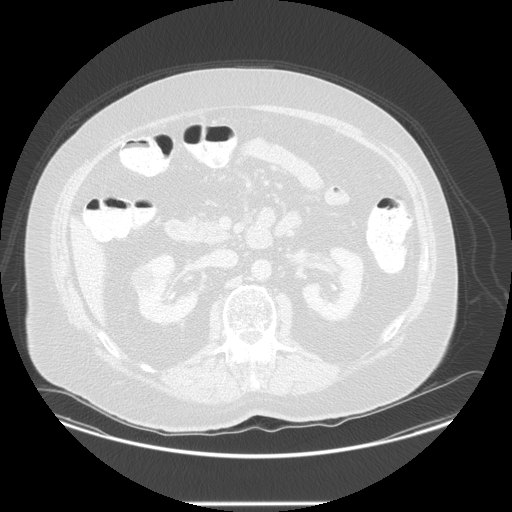
[im 23/23  soft-tissue]
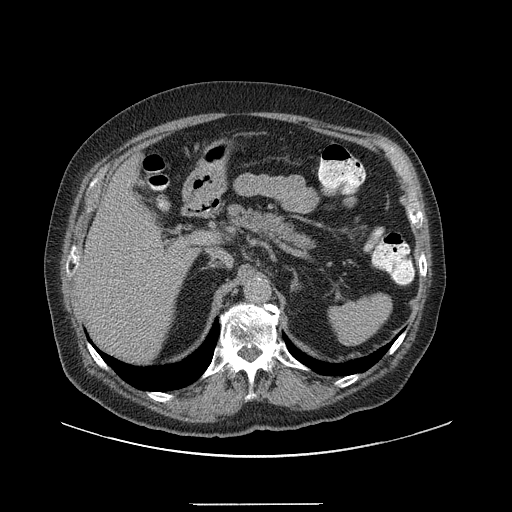
[im 23/23  lung]
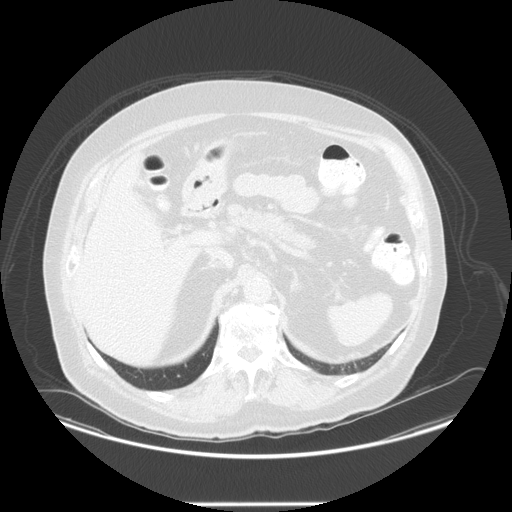

[Series 400: sagittal · sagittal · 1.19mm/px · 1 of 171 slices shown, 2 images]
[im 57/171  soft-tissue]
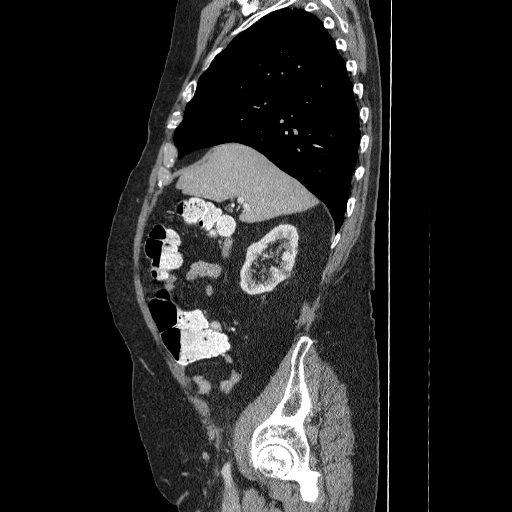
[im 57/171  bone]
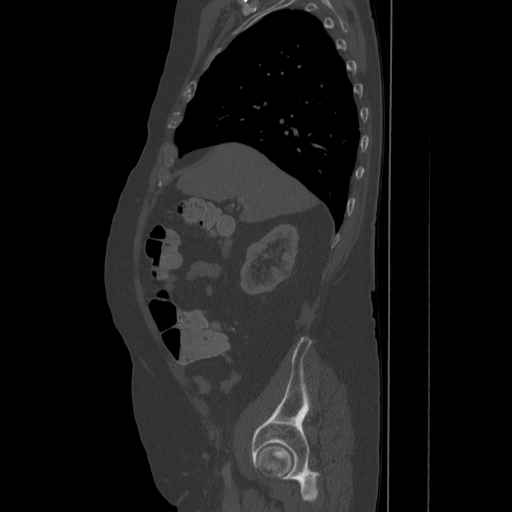

[Series 401: coronal · coronal · 1.19mm/px · 3 of 141 slices shown, 4 images]
[im 47/141  soft-tissue]
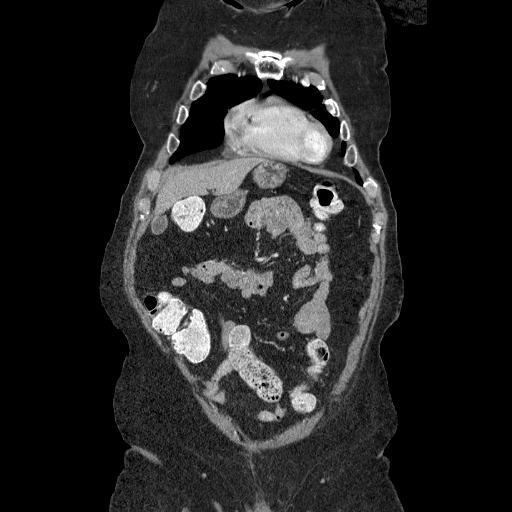
[im 63/141  soft-tissue]
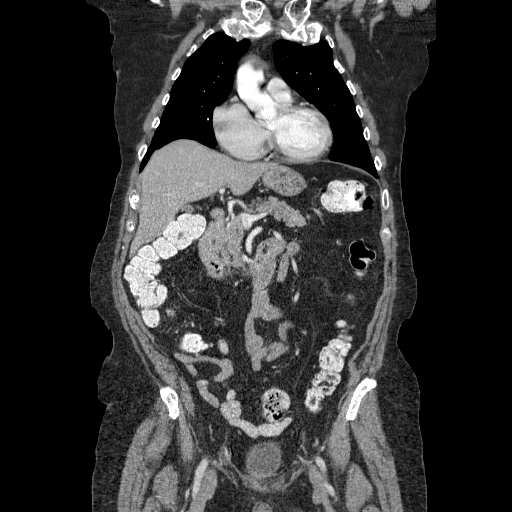
[im 63/141  bone]
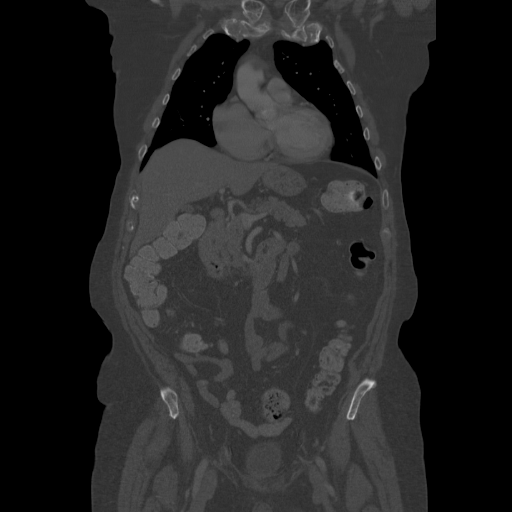
[im 78/141  soft-tissue]
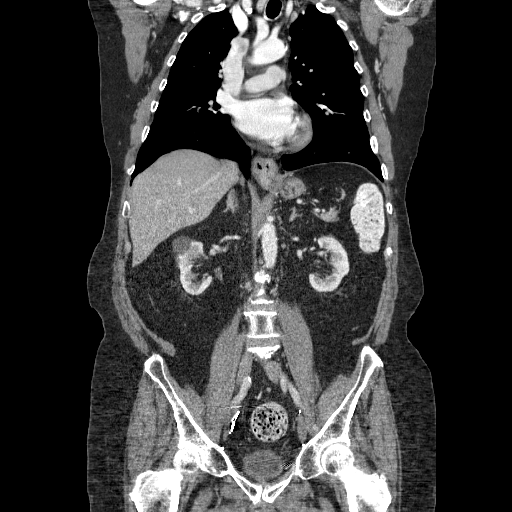

[12 of 46 positions shown; findings below may reference images not displayed]

FINDINGS: Small hiatal hernia.  There is circumferential wall
thickening within the lower esophagus suggesting esophagitis. No
mediastinal, hilar, or axillary adenopathy.  Heart is normal size.
Aorta is normal caliber. Visualized thyroid and chest wall soft
tissues unremarkable.

Scarring in the apices bilaterally.  Within the left lower lobe
posteriorly, there is a subtle solid ground-glass nodule measuring
13 mm, nonspecific.  Right lung is clear.  No pleural effusions.

Diffuse osteopenia throughout the thoracic spine.  Degenerative
changes present.  There is a sclerotic focus in the T6 vertebral
body, nonspecific.  This may reflect a bone island, but given the
patient's history of prostate cancer, recommend attention on follow-
up imaging.  Multiple old healed left posterior rib fractures and
lateral rib fractures.
IMPRESSION: 13 mm ground-glass nodule in the left lower lobe.  This may
represent area of inflammation, but recommend follow up with repeat
CT in 6 months.

Small sclerotic focus within the left side of the T6 vertebral
body, nonspecific.

Small hiatal hernia with distal esophageal circumferential wall
thickening suggesting esophagitis.

CT ABDOMEN AND PELVIS
FINDINGS: Liver, gallbladder, stomach, pancreas, spleen, adrenals
have an unremarkable appearance.  Small cystic areas within the
kidneys bilaterally, right greater than left which appear represent
benign cysts.  No hydronephrosis.

Scattered descending colonic and sigmoid diverticulosis.  Appendix
is visualized and is normal.  Small bowel is decompressed.  Aorta
is calcified, non-aneurysmal.  No free fluid, free air or
adenopathy.  Urinary bladder is decompressed.  Small left inguinal
hernia containing fat.  Postsurgical changes in the pelvis from
prior prostatectomy.

Old healed left inferior pubic ramus fracture.  Degenerative
changes in the lumbar spine.  No suspicious bone lesion.
IMPRESSION: No acute findings in the abdomen or pelvis. No evidence of
recurrent or metastatic disease.  Chronic changes as above.

## 2014-01-30 ENCOUNTER — Telehealth: Payer: Self-pay | Admitting: Oncology

## 2014-01-30 NOTE — Telephone Encounter (Signed)
line busy....mailed pt appt sched and letter °

## 2014-02-06 ENCOUNTER — Telehealth: Payer: Self-pay | Admitting: *Deleted

## 2014-02-06 ENCOUNTER — Ambulatory Visit: Payer: Medicare Other | Admitting: Oncology

## 2014-02-06 NOTE — Telephone Encounter (Signed)
Call made to [facility] in effort to contact pt as he missed appt with Dr. Benay Spice today. He recently moved to [place]   Spoke with Evelena Peat, receptionist.  She states that Dr. Lorenza Chick does not have phone # listed with [facility].  Evelena Peat states that she will pass message onto Dr. Lorenza Chick to return call to Dr. Gearldine Shown office.

## 2014-02-06 NOTE — Telephone Encounter (Signed)
Pt was a no show for MD appt ---attempted to call X1 --no answer / unable to leave message.

## 2014-02-23 ENCOUNTER — Telehealth: Payer: Self-pay | Admitting: Oncology

## 2014-02-23 NOTE — Telephone Encounter (Signed)
s/w pt re appt for 9/16. appt moved from 9/18.

## 2014-02-25 ENCOUNTER — Ambulatory Visit (HOSPITAL_BASED_OUTPATIENT_CLINIC_OR_DEPARTMENT_OTHER): Payer: Medicare Other | Admitting: Oncology

## 2014-02-25 ENCOUNTER — Telehealth: Payer: Self-pay | Admitting: Oncology

## 2014-02-25 ENCOUNTER — Other Ambulatory Visit (HOSPITAL_BASED_OUTPATIENT_CLINIC_OR_DEPARTMENT_OTHER): Payer: Medicare Other

## 2014-02-25 ENCOUNTER — Other Ambulatory Visit: Payer: Self-pay | Admitting: *Deleted

## 2014-02-25 VITALS — BP 140/46 | HR 67 | Temp 98.4°F | Resp 18 | Ht 71.0 in | Wt 227.6 lb

## 2014-02-25 DIAGNOSIS — C61 Malignant neoplasm of prostate: Secondary | ICD-10-CM

## 2014-02-25 DIAGNOSIS — C20 Malignant neoplasm of rectum: Secondary | ICD-10-CM

## 2014-02-25 DIAGNOSIS — D649 Anemia, unspecified: Secondary | ICD-10-CM

## 2014-02-25 LAB — CBC WITH DIFFERENTIAL/PLATELET
BASO%: 0.6 % (ref 0.0–2.0)
BASOS ABS: 0 10*3/uL (ref 0.0–0.1)
EOS ABS: 0.1 10*3/uL (ref 0.0–0.5)
EOS%: 1.9 % (ref 0.0–7.0)
HCT: 32.7 % — ABNORMAL LOW (ref 38.4–49.9)
HEMOGLOBIN: 10.6 g/dL — AB (ref 13.0–17.1)
LYMPH%: 15.8 % (ref 14.0–49.0)
MCH: 30.9 pg (ref 27.2–33.4)
MCHC: 32.4 g/dL (ref 32.0–36.0)
MCV: 95.5 fL (ref 79.3–98.0)
MONO#: 1 10*3/uL — AB (ref 0.1–0.9)
MONO%: 12.8 % (ref 0.0–14.0)
NEUT%: 68.9 % (ref 39.0–75.0)
NEUTROS ABS: 5.2 10*3/uL (ref 1.5–6.5)
PLATELETS: 320 10*3/uL (ref 140–400)
RBC: 3.42 10*6/uL — ABNORMAL LOW (ref 4.20–5.82)
RDW: 14.2 % (ref 11.0–14.6)
WBC: 7.6 10*3/uL (ref 4.0–10.3)
lymph#: 1.2 10*3/uL (ref 0.9–3.3)

## 2014-02-25 LAB — COMPREHENSIVE METABOLIC PANEL (CC13)
ALBUMIN: 3.2 g/dL — AB (ref 3.5–5.0)
ALK PHOS: 77 U/L (ref 40–150)
ALT: 9 U/L (ref 0–55)
ANION GAP: 10 meq/L (ref 3–11)
AST: 16 U/L (ref 5–34)
BILIRUBIN TOTAL: 0.25 mg/dL (ref 0.20–1.20)
BUN: 34 mg/dL — AB (ref 7.0–26.0)
CO2: 23 meq/L (ref 22–29)
Calcium: 10.1 mg/dL (ref 8.4–10.4)
Chloride: 105 mEq/L (ref 98–109)
Creatinine: 1.8 mg/dL — ABNORMAL HIGH (ref 0.7–1.3)
GLUCOSE: 103 mg/dL (ref 70–140)
POTASSIUM: 5 meq/L (ref 3.5–5.1)
Sodium: 139 mEq/L (ref 136–145)
Total Protein: 6.7 g/dL (ref 6.4–8.3)

## 2014-02-25 MED ORDER — PROCHLORPERAZINE MALEATE 5 MG PO TABS: 5.0000 mg | ORAL_TABLET | Freq: Four times a day (QID) | ORAL | Status: AC | PRN

## 2014-02-25 MED ORDER — CAPECITABINE 500 MG PO TABS: 1500.0000 mg | ORAL_TABLET | Freq: Two times a day (BID) | ORAL | Status: DC

## 2014-02-25 NOTE — Progress Notes (Signed)
  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 The PSA was elevated at 57.8 on 01/19/2014. He saw Dr. Rosana Hoes 01/26/2014. He did not wish to resume Lupron. Dr. Rosana Hoes recommended considering abiraterone for treatment of the prostate cancer.  Dr. Lorenza Chick has noted increased rectal discomfort and bleeding. He feels like he is sitting on a "golf ball ". He takes naproxen for left knee pain. Good appetite. He is active getting out of the home. He reports malaise. No back pain.  Objective:  Vital signs in last 24 hours:  Blood pressure 140/46, pulse 67, temperature 98.4 F (36.9 C), temperature source Oral, resp. rate 18, height 5\' 11"  (1.803 m), weight 227 lb 9.6 oz (103.239 kg).    HEENT: Neck without mass Lymphatics: No cervical, supra-clavicular, axillary, or inguinal nodes Resp: Lungs clear bilaterally Cardio: Regular rate and rhythm GI: No hepatosplenomegaly, nontender, no mass Vascular: No leg edema Rectal: There is a mass at the anterior anal canal beginning 2-3 cm from the anal verge. The mass is chiefly centered over the anterior anal canal and left rectum extending toward the midline posteriorly   Lab Results:  Lab Results  Component Value Date   WBC 7.6 02/25/2014   HGB 10.6* 02/25/2014   HCT 32.7* 02/25/2014   MCV 95.5 02/25/2014   PLT 320 02/25/2014   NEUTROABS 5.2 02/25/2014    Medications: I have reviewed the patient's current medications.  Assessment/Plan: 1.Rectal cancer, 1-2 centimeters above the dentate line,uT3N0, invasive adenocarcinoma confirmed on a colonoscopic biopsy 09/03/2012. He began concurrent Xeloda and radiation on 01/13/2013, completed 01/31/2013.   Progress of rectal symptoms, initiation of single agent capecitabine 03/02/2014  2. Prostate cancer, status post a prostatectomy in 1993, pelvic radiation in 1998 for a rising PSA, subsequent hormonal therapy  and now maintained off of specific therapy with a rising PSA. He is followed by Dr. Rosana Hoes.  3. Left lower lobe 13 mm groundglass nodule on a CT of the chest 09/05/2012.  4. Sclerotic focus in the T6 vertebra on the chest CT 09/05/2012.  5. Rectal urgency and bleeding secondary to #1. He has increased rectal bleeding and discomfort 6. 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 I suspect the malaise is related to anemia and rectal cancer, but the prostate cancer may be concluding to the malaise. He otherwise appears asymptomatic from prostate cancer.  I discussed treatment options with Dr. Lorenza Chick. We discussed systemic chemotherapy options since he is not a candidate for further radiation or resection of the rectal tumor. He does not wish to receive IV chemotherapy at present. We decided to begin a trial of single agent capecitabine. He understands the chance of capecitabine improving his symptoms is small. He has received capecitabine in the past. He knows to contact us for diarrhea or hand/foot pain. The plan is to begin capecitabine on a 7 day on/7 day off schedule 03/02/2014. He will return for an office visit 03/24/2014.  We will consider FOLFOX or FOLFIRI if he does not respond to the capecitabine.  The PSA was checked today. We will consider obtaining a bone scan and treatment of the prostate cancer if the PSA continues to rise.  Betsy Coder, MD  02/25/2014  1:14 PM

## 2014-02-25 NOTE — Telephone Encounter (Signed)
Pt confirmed labs/ov per 09/16 POF, gave pt AVS....KJ °

## 2014-02-25 NOTE — Progress Notes (Signed)
Faxed Xeloda script with demographic data and copy of insurance cards to [facility].  Escribed compazine 5 mg prn for nausea.

## 2014-02-26 LAB — PSA: PSA: 59.99 ng/mL — AB (ref <=4.00)

## 2014-02-26 LAB — CEA: CEA: 3.4 ng/mL (ref 0.0–5.0)

## 2014-02-27 ENCOUNTER — Ambulatory Visit: Payer: Medicare Other | Admitting: Oncology

## 2014-03-24 ENCOUNTER — Other Ambulatory Visit (HOSPITAL_BASED_OUTPATIENT_CLINIC_OR_DEPARTMENT_OTHER): Payer: Medicare Other

## 2014-03-24 ENCOUNTER — Ambulatory Visit (HOSPITAL_BASED_OUTPATIENT_CLINIC_OR_DEPARTMENT_OTHER): Payer: Medicare Other | Admitting: Oncology

## 2014-03-24 ENCOUNTER — Other Ambulatory Visit: Payer: Self-pay | Admitting: *Deleted

## 2014-03-24 ENCOUNTER — Telehealth: Payer: Self-pay | Admitting: Oncology

## 2014-03-24 VITALS — BP 131/51 | HR 67 | Temp 98.0°F | Resp 20 | Ht 71.0 in | Wt 225.8 lb

## 2014-03-24 DIAGNOSIS — Z23 Encounter for immunization: Secondary | ICD-10-CM

## 2014-03-24 DIAGNOSIS — C61 Malignant neoplasm of prostate: Secondary | ICD-10-CM

## 2014-03-24 DIAGNOSIS — C20 Malignant neoplasm of rectum: Secondary | ICD-10-CM

## 2014-03-24 LAB — CBC WITH DIFFERENTIAL/PLATELET
BASO%: 0.4 % (ref 0.0–2.0)
Basophils Absolute: 0 10*3/uL (ref 0.0–0.1)
EOS ABS: 0.2 10*3/uL (ref 0.0–0.5)
EOS%: 2.8 % (ref 0.0–7.0)
HEMATOCRIT: 32.4 % — AB (ref 38.4–49.9)
HGB: 10.4 g/dL — ABNORMAL LOW (ref 13.0–17.1)
LYMPH%: 21.8 % (ref 14.0–49.0)
MCH: 31.3 pg (ref 27.2–33.4)
MCHC: 32 g/dL (ref 32.0–36.0)
MCV: 97.7 fL (ref 79.3–98.0)
MONO#: 0.8 10*3/uL (ref 0.1–0.9)
MONO%: 12.7 % (ref 0.0–14.0)
NEUT%: 62.3 % (ref 39.0–75.0)
NEUTROS ABS: 4 10*3/uL (ref 1.5–6.5)
PLATELETS: 272 10*3/uL (ref 140–400)
RBC: 3.32 10*6/uL — ABNORMAL LOW (ref 4.20–5.82)
RDW: 14.5 % (ref 11.0–14.6)
WBC: 6.5 10*3/uL (ref 4.0–10.3)
lymph#: 1.4 10*3/uL (ref 0.9–3.3)

## 2014-03-24 MED ORDER — INFLUENZA VAC SPLIT QUAD 0.5 ML IM SUSY
0.5000 mL | PREFILLED_SYRINGE | Freq: Once | INTRAMUSCULAR | Status: AC
  Administered 2014-03-24: 0.5 mL via INTRAMUSCULAR
  Filled 2014-03-24: qty 0.5

## 2014-03-24 MED ORDER — CAPECITABINE 500 MG PO TABS: 1500.0000 mg | ORAL_TABLET | Freq: Two times a day (BID) | ORAL | Status: DC

## 2014-03-24 NOTE — Progress Notes (Signed)
  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 He has completed 2 weekly cycles of capecitabine. He reports mild nausea and a few episodes of diarrhea. No mouth sores or hand/foot pain. The rectal bleeding and discomfort have improved. He has not taken nausea medication.  Objective:  Vital signs in last 24 hours:  Blood pressure 131/51, pulse 67, temperature 98 F (36.7 C), temperature source Oral, resp. rate 20, height 5\' 11"  (1.803 m), weight 225 lb 12.8 oz (102.422 kg).    HEENT: No thrush or ulcers Resp: Lungs clear bilaterally Cardio: Regular rate and rhythm GI: No hepatomegaly, nontender Vascular: No leg edema  Skin: Palms without erythema     Lab Results:  Lab Results  Component Value Date   WBC 6.5 03/24/2014   HGB 10.4* 03/24/2014   HCT 32.4* 03/24/2014   MCV 97.7 03/24/2014   PLT 272 03/24/2014   NEUTROABS 4.0 03/24/2014    Medications: I have reviewed the patient's current medications.  Assessment/Plan: 1.Rectal cancer, 1-2 centimeters above the dentate line,uT3N0, invasive adenocarcinoma confirmed on a colonoscopic biopsy 09/03/2012. He began concurrent Xeloda and radiation on 01/13/2013, completed 01/31/2013.  Progress of rectal symptoms, initiation of single agent capecitabine 03/02/2014, 7 days on/7 days off 2. Prostate cancer, status post a prostatectomy in 1993, pelvic radiation in 1998 for a rising PSA, subsequent hormonal therapy and now maintained off of specific therapy with a rising PSA. He is followed by Dr. Rosana Hoes.  3. Left lower lobe 13 mm groundglass nodule on a CT of the chest 09/05/2012.  4. Sclerotic focus in the T6 vertebra on the chest CT 09/05/2012.  5. Rectal urgency and bleeding secondary to #1. Improved 6. Anemia secondary to rectal bleeding, prostate cancer, and chemotherapy/radiation-stable    Disposition:  Dr. Lorenza Chick has completed 2 weeks of  treatment with capecitabine. He is tolerating the capecitabine well and the rectal symptoms are partially improved. The plan is to begin another cycle of capecitabine on 03/30/2014. He will return for an office visit 04/22/2014.   Betsy Coder, MD  03/24/2014  9:25 AM

## 2014-03-24 NOTE — Telephone Encounter (Signed)
gv pt appt schedule for nov.  °

## 2014-03-25 ENCOUNTER — Encounter: Payer: Self-pay | Admitting: *Deleted

## 2014-03-25 ENCOUNTER — Other Ambulatory Visit: Payer: Self-pay | Admitting: *Deleted

## 2014-03-25 DIAGNOSIS — C20 Malignant neoplasm of rectum: Secondary | ICD-10-CM

## 2014-03-25 MED ORDER — CAPECITABINE 500 MG PO TABS: 1500.0000 mg | ORAL_TABLET | Freq: Two times a day (BID) | ORAL | Status: DC

## 2014-03-25 NOTE — Telephone Encounter (Signed)
Request from [facility] to  Re-send Xeloda script with diagnosis codes for insurance billing. Resent order.

## 2014-03-27 ENCOUNTER — Other Ambulatory Visit: Payer: Self-pay

## 2014-04-22 ENCOUNTER — Ambulatory Visit (HOSPITAL_BASED_OUTPATIENT_CLINIC_OR_DEPARTMENT_OTHER): Payer: Medicare Other | Admitting: Oncology

## 2014-04-22 ENCOUNTER — Telehealth: Payer: Self-pay | Admitting: Oncology

## 2014-04-22 ENCOUNTER — Other Ambulatory Visit (HOSPITAL_BASED_OUTPATIENT_CLINIC_OR_DEPARTMENT_OTHER): Payer: Medicare Other

## 2014-04-22 VITALS — BP 122/50 | HR 63 | Temp 98.4°F | Resp 18 | Ht 71.0 in | Wt 224.6 lb

## 2014-04-22 DIAGNOSIS — D630 Anemia in neoplastic disease: Secondary | ICD-10-CM

## 2014-04-22 DIAGNOSIS — D6481 Anemia due to antineoplastic chemotherapy: Secondary | ICD-10-CM

## 2014-04-22 DIAGNOSIS — C20 Malignant neoplasm of rectum: Secondary | ICD-10-CM

## 2014-04-22 LAB — CBC WITH DIFFERENTIAL/PLATELET
BASO%: 0.6 % (ref 0.0–2.0)
Basophils Absolute: 0 10*3/uL (ref 0.0–0.1)
EOS%: 2 % (ref 0.0–7.0)
Eosinophils Absolute: 0.1 10*3/uL (ref 0.0–0.5)
HEMATOCRIT: 31.9 % — AB (ref 38.4–49.9)
HGB: 10.3 g/dL — ABNORMAL LOW (ref 13.0–17.1)
LYMPH%: 20.2 % (ref 14.0–49.0)
MCH: 32.7 pg (ref 27.2–33.4)
MCHC: 32.4 g/dL (ref 32.0–36.0)
MCV: 100.8 fL — ABNORMAL HIGH (ref 79.3–98.0)
MONO#: 0.9 10*3/uL (ref 0.1–0.9)
MONO%: 12.8 % (ref 0.0–14.0)
NEUT#: 4.3 10*3/uL (ref 1.5–6.5)
NEUT%: 64.4 % (ref 39.0–75.0)
PLATELETS: 276 10*3/uL (ref 140–400)
RBC: 3.16 10*6/uL — ABNORMAL LOW (ref 4.20–5.82)
RDW: 19.9 % — ABNORMAL HIGH (ref 11.0–14.6)
WBC: 6.7 10*3/uL (ref 4.0–10.3)
lymph#: 1.3 10*3/uL (ref 0.9–3.3)

## 2014-04-22 LAB — COMPREHENSIVE METABOLIC PANEL (CC13)
ALT: 8 U/L (ref 0–55)
ANION GAP: 9 meq/L (ref 3–11)
AST: 13 U/L (ref 5–34)
Albumin: 3.2 g/dL — ABNORMAL LOW (ref 3.5–5.0)
Alkaline Phosphatase: 67 U/L (ref 40–150)
BILIRUBIN TOTAL: 0.26 mg/dL (ref 0.20–1.20)
BUN: 33.9 mg/dL — ABNORMAL HIGH (ref 7.0–26.0)
CO2: 26 mEq/L (ref 22–29)
CREATININE: 2 mg/dL — AB (ref 0.7–1.3)
Calcium: 10.2 mg/dL (ref 8.4–10.4)
Chloride: 105 mEq/L (ref 98–109)
Glucose: 114 mg/dl (ref 70–140)
Potassium: 4.4 mEq/L (ref 3.5–5.1)
Sodium: 139 mEq/L (ref 136–145)
Total Protein: 6.2 g/dL — ABNORMAL LOW (ref 6.4–8.3)

## 2014-04-22 NOTE — Progress Notes (Signed)
  [place] OFFICE PROGRESS NOTE   Diagnosis: rectal cancer, prostate cancer  INTERVAL HISTORY:   Dr. Lorenza Chick completed the most recent week of Xeloda on 04/19/2014. He denies mouth sores, hand/foot pain, and diarrhea. He complains of malaise. He has a mucous discharge from the rectum and occasional bleeding. The bleeding and rectal discomfort have improved. He reports a few episodes of nausea relieved with Compazine. He is not able to go without his usual activities secondary to malaise and feeling unsteady with walking. He reports an erythematous rash over the arms that improves a few days after completing Xeloda.  Objective:  Vital signs in last 24 hours:  Blood pressure 122/50, pulse 63, temperature 98.4 F (36.9 C), temperature source Oral, resp. rate 18, height 5\' 11"  (1.803 m), weight 224 lb 9.6 oz (101.878 kg).    HEENT: no thrush or ulcers Lymphatics: no cervical, supra-clavicular, axillary, or inguinal nodes Resp: lungs clear bilaterally Cardio: regular rate and rhythm GI: no hepatomegaly, nontender, no mass Vascular: no leg edema Rectal: Firm mass occupying the anterior anal canal beginning approximately 1 cm from the anal verge. The mass extends to the tip of the examination finger.      Lab Results:  Lab Results  Component Value Date   WBC 6.7 04/22/2014   HGB 10.3* 04/22/2014   HCT 31.9* 04/22/2014   MCV 100.8* 04/22/2014   PLT 276 04/22/2014   NEUTROABS 4.3 04/22/2014   Sodium 139, potassium 4.4, BUN 33.9, creatinine 2.0, bilirubin 0.26  Lab Results  Component Value Date   CEA 3.4 02/25/2014    Medications: I have reviewed the patient's current medications.  Assessment/Plan: 1.Rectal cancer, 1-2 centimeters above the dentate line,uT3N0, invasive adenocarcinoma confirmed on a colonoscopic biopsy 09/03/2012. He began concurrent Xeloda and radiation on 01/13/2013, completed 01/31/2013.   Progress of rectal symptoms, initiation of single  agent capecitabine 03/02/2014, 7 days on/7 days off 2. Prostate cancer, status post a prostatectomy in 1993, pelvic radiation in 1998 for a rising PSA, subsequent hormonal therapy and now maintained off of specific therapy with a rising PSA. He is followed by Dr. Rosana Hoes.  3. Left lower lobe 13 mm groundglass nodule on a CT of the chest 09/05/2012.  4. Sclerotic focus in the T6 vertebra on the chest CT 09/05/2012.  5. Rectal urgency and bleeding secondary to #1. Improved 6. Anemia secondary to rectal bleeding, prostate cancer, and chemotherapy/radiation-stable   Disposition:  Dr. 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 The rectal pain and bleeding have improved, but he has developed progressive malaise. He states that he feels he is "declining ". The malaise may be related to rectal cancer, Xeloda, renal failure,prostate cancer, or another cause. He will hold naproxen and enalapril with the increased creatinine. We decided to proceed with a restaging CT evaluation. He will receive oral contrast only with the CT. Dr. Lorenza Chick will return for an office visit 05/01/2014.    Betsy Coder, MD  04/22/2014  10:27 AM

## 2014-04-22 NOTE — Telephone Encounter (Signed)
gv adn printed appt sched and avs fo rpt for NOV....gv pt barium

## 2014-05-01 ENCOUNTER — Other Ambulatory Visit (HOSPITAL_BASED_OUTPATIENT_CLINIC_OR_DEPARTMENT_OTHER): Payer: Medicare Other

## 2014-05-01 ENCOUNTER — Encounter (HOSPITAL_COMMUNITY): Payer: Self-pay

## 2014-05-01 ENCOUNTER — Ambulatory Visit (HOSPITAL_COMMUNITY)
Admission: RE | Admit: 2014-05-01 | Discharge: 2014-05-01 | Disposition: A | Discharge status: Home / Self Care | Payer: Medicare Other | Source: Ambulatory Visit | Attending: Oncology | Admitting: Oncology

## 2014-05-01 ENCOUNTER — Ambulatory Visit (HOSPITAL_BASED_OUTPATIENT_CLINIC_OR_DEPARTMENT_OTHER): Payer: Medicare Other | Admitting: Oncology

## 2014-05-01 VITALS — BP 157/63 | HR 68 | Temp 98.4°F | Resp 19 | Ht 71.0 in | Wt 219.3 lb

## 2014-05-01 DIAGNOSIS — I2510 Atherosclerotic heart disease of native coronary artery without angina pectoris: Secondary | ICD-10-CM | POA: Diagnosis not present

## 2014-05-01 DIAGNOSIS — M5137 Other intervertebral disc degeneration, lumbosacral region: Secondary | ICD-10-CM | POA: Insufficient documentation

## 2014-05-01 DIAGNOSIS — C7951 Secondary malignant neoplasm of bone: Secondary | ICD-10-CM | POA: Insufficient documentation

## 2014-05-01 DIAGNOSIS — R918 Other nonspecific abnormal finding of lung field: Secondary | ICD-10-CM | POA: Diagnosis not present

## 2014-05-01 DIAGNOSIS — C20 Malignant neoplasm of rectum: Secondary | ICD-10-CM

## 2014-05-01 DIAGNOSIS — Z85048 Personal history of other malignant neoplasm of rectum, rectosigmoid junction, and anus: Secondary | ICD-10-CM | POA: Insufficient documentation

## 2014-05-01 DIAGNOSIS — I700 Atherosclerosis of aorta: Secondary | ICD-10-CM | POA: Diagnosis not present

## 2014-05-01 DIAGNOSIS — C61 Malignant neoplasm of prostate: Secondary | ICD-10-CM

## 2014-05-01 DIAGNOSIS — D630 Anemia in neoplastic disease: Secondary | ICD-10-CM

## 2014-05-01 DIAGNOSIS — M8580 Other specified disorders of bone density and structure, unspecified site: Secondary | ICD-10-CM | POA: Diagnosis not present

## 2014-05-01 DIAGNOSIS — K5900 Constipation, unspecified: Secondary | ICD-10-CM

## 2014-05-01 DIAGNOSIS — Z8546 Personal history of malignant neoplasm of prostate: Secondary | ICD-10-CM | POA: Diagnosis present

## 2014-05-01 DIAGNOSIS — D500 Iron deficiency anemia secondary to blood loss (chronic): Secondary | ICD-10-CM

## 2014-05-01 DIAGNOSIS — Z9079 Acquired absence of other genital organ(s): Secondary | ICD-10-CM | POA: Diagnosis not present

## 2014-05-01 DIAGNOSIS — N281 Cyst of kidney, acquired: Secondary | ICD-10-CM | POA: Insufficient documentation

## 2014-05-01 DIAGNOSIS — N289 Disorder of kidney and ureter, unspecified: Secondary | ICD-10-CM

## 2014-05-01 DIAGNOSIS — M899 Disorder of bone, unspecified: Secondary | ICD-10-CM

## 2014-05-01 DIAGNOSIS — K449 Diaphragmatic hernia without obstruction or gangrene: Secondary | ICD-10-CM | POA: Insufficient documentation

## 2014-05-01 LAB — CBC WITH DIFFERENTIAL/PLATELET
BASO%: 0.2 % (ref 0.0–2.0)
BASOS ABS: 0 10*3/uL (ref 0.0–0.1)
EOS%: 0.8 % (ref 0.0–7.0)
Eosinophils Absolute: 0.1 10*3/uL (ref 0.0–0.5)
HEMATOCRIT: 31.7 % — AB (ref 38.4–49.9)
HEMOGLOBIN: 10.6 g/dL — AB (ref 13.0–17.1)
LYMPH#: 1.4 10*3/uL (ref 0.9–3.3)
LYMPH%: 14.8 % (ref 14.0–49.0)
MCH: 33.3 pg (ref 27.2–33.4)
MCHC: 33.4 g/dL (ref 32.0–36.0)
MCV: 99.7 fL — ABNORMAL HIGH (ref 79.3–98.0)
MONO#: 0.9 10*3/uL (ref 0.1–0.9)
MONO%: 10 % (ref 0.0–14.0)
NEUT#: 6.8 10*3/uL — ABNORMAL HIGH (ref 1.5–6.5)
NEUT%: 74.2 % (ref 39.0–75.0)
Platelets: 244 10*3/uL (ref 140–400)
RBC: 3.18 10*6/uL — ABNORMAL LOW (ref 4.20–5.82)
RDW: 20.3 % — ABNORMAL HIGH (ref 11.0–14.6)
WBC: 9.2 10*3/uL (ref 4.0–10.3)

## 2014-05-01 LAB — BASIC METABOLIC PANEL (CC13)
Anion Gap: 11 mEq/L (ref 3–11)
BUN: 27.5 mg/dL — AB (ref 7.0–26.0)
CHLORIDE: 101 meq/L (ref 98–109)
CO2: 25 meq/L (ref 22–29)
CREATININE: 1.8 mg/dL — AB (ref 0.7–1.3)
Calcium: 10.4 mg/dL (ref 8.4–10.4)
GLUCOSE: 160 mg/dL — AB (ref 70–140)
Potassium: 4.3 mEq/L (ref 3.5–5.1)
Sodium: 137 mEq/L (ref 136–145)

## 2014-05-01 NOTE — Progress Notes (Signed)
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 Stable rectal bleeding. He continues to have malaise. He reports increased constipation.  Objective:  Vital signs in last 24 hours:  Blood pressure 157/63, pulse 68, temperature 98.4 F (36.9 C), temperature source Oral, resp. rate 19, height 5\' 11"  (1.803 m), weight 219 lb 4.8 oz (99.474 kg), SpO2 97 %.   Physical examination: Not performed today  Lab Results:  Lab Results  Component Value Date   WBC 9.2 05/01/2014   HGB 10.6* 05/01/2014   HCT 31.7* 05/01/2014   MCV 99.7* 05/01/2014   PLT 244 05/01/2014   NEUTROABS 6.8* 05/01/2014   Creatinine 1.8, BUN 27.5  Imaging:  Ct Abdomen Pelvis Wo Contrast  05/01/2014   CLINICAL DATA:  History of prostate cancer and rectal cancer.  EXAM: CT CHEST, ABDOMEN AND PELVIS WITHOUT CONTRAST  TECHNIQUE: Multidetector CT imaging of the chest, abdomen and pelvis was performed following the standard protocol without IV contrast.  COMPARISON:  09/05/2012  FINDINGS: CT CHEST FINDINGS  Mediastinum: The heart size is normal. There is no pericardial effusion identified. Calcified atherosclerotic plaque involves the thoracic aorta as well as the LAD coronary artery. No enlarged mediastinal or hilar lymph nodes identified.  Lungs/Pleura: There is no pleural effusion identified. Ground-glass attenuating nodule within the left upper lobe is unchanged measuring 1 cm, image 17/series 4. Irregular nodular density in the anterior left upper lobe is stable, image 30/series 4. Ground-glass attenuating nodule in the left lower lobe is unchanged measuring 2 cm. No solid component is associated with this nodule. No new or enlarging nodule or mass noted.  Musculoskeletal: The bones appear osteopenic. There is changes of multi level degenerative disc disease within the thoracic spine interval increase in size of sclerotic bone  metastasis within the thoracic spine. Index lesion at T6 measures 1.8 cm, image 81 of series 603. Previously 9 mm. Sclerotic lesion within the T2 vertebra is new measuring 5 mm.  CT ABDOMEN AND PELVIS FINDINGS  Hepatobiliary: No focal liver abnormality identified. The gallbladder appears normal. No biliary dilatation.  Pancreas: Unremarkable appearance of the pancreas.  Spleen: The spleen appears normal.  Adrenals/Urinary Tract: The adrenal glands are both normal. Right renal cysts are again noted, incompletely characterized without IV contrast. These appear unchanged from previous exam. No obstructive uropathy. The urinary bladder appears normal for degree of distention.  Stomach/Bowel: Small hiatal hernia. The small bowel loops have a normal course and caliber. No obstruction. The appendix is visualized and appears normal. Normal appearance of the proximal colon. There is mild circumferential wall thickening involving the rectum. The distal rectum is unopacified and thus incompletely assessed. This may be better assessed with direct visualization.  Vascular/Lymphatic: Calcified atherosclerotic disease involves the abdominal aorta. No aneurysm. No enlarged retroperitoneal or mesenteric adenopathy. Previous pelvic lymph node dissection. There is a new pelvic lymph node in the left side of the prostatectomy bed which measures 1.4 x 0.9 cm, image 117/series 2.  Reproductive: Previous surgical resection of the prostate gland and seminal vesicles.  Other: No ascites or focal fluid collections identified.  Musculoskeletal: There is been progression of sclerotic bone metastases. Sclerotic lesion involving the L2 vertebra measures 1.6 cm, image 72/ series 603. Previously this measured 5 mm. New sclerotic lesion within the L1 vertebra measures 1.7 cm, image 74 of series 603. Degenerative disc disease is noted at L5-S1.  IMPRESSION: 1. 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 2. New lymph node within  the left side of the prostatectomy bed. Although non pathologically enlarged this is suspicious and warrants close interval follow-up. 3. Atherosclerotic disease. 4. Stable ground-glass attenuating nodules. These will require documented stability for a period of 36 months. This recommendation follows the consensus statement: Recommendations for the Management of Subsolid Pulmonary Nodules Detected at CT: A Statement from the [place]. Radiology [id number].   Electronically Signed   By: Kerby Moors M.D.   On: 05/01/2014 13:13   Ct Chest Wo Contrast  05/01/2014   CLINICAL DATA:  History of prostate cancer and rectal cancer.  EXAM: CT CHEST, ABDOMEN AND PELVIS WITHOUT CONTRAST  TECHNIQUE: Multidetector CT imaging of the chest, abdomen and pelvis was performed following the standard protocol without IV contrast.  COMPARISON:  09/05/2012  FINDINGS: CT CHEST FINDINGS  Mediastinum: The heart size is normal. There is no pericardial effusion identified. Calcified atherosclerotic plaque involves the thoracic aorta as well as the LAD coronary artery. No enlarged mediastinal or hilar lymph nodes identified.  Lungs/Pleura: There is no pleural effusion identified. Ground-glass attenuating nodule within the left upper lobe is unchanged measuring 1 cm, image 17/series 4. Irregular nodular density in the anterior left upper lobe is stable, image 30/series 4. Ground-glass attenuating nodule in the left lower lobe is unchanged measuring 2 cm. No solid component is associated with this nodule. No new or enlarging nodule or mass noted.  Musculoskeletal: The bones appear osteopenic. There is changes of multi level degenerative disc disease within the thoracic spine interval increase in size of sclerotic bone metastasis within the thoracic spine. Index lesion at T6 measures 1.8 cm, image 81 of series 603. Previously 9 mm. Sclerotic lesion within the T2 vertebra is new measuring 5 mm.  CT ABDOMEN AND PELVIS FINDINGS   Hepatobiliary: No focal liver abnormality identified. The gallbladder appears normal. No biliary dilatation.  Pancreas: Unremarkable appearance of the pancreas.  Spleen: The spleen appears normal.  Adrenals/Urinary Tract: The adrenal glands are both normal. Right renal cysts are again noted, incompletely characterized without IV contrast. These appear unchanged from previous exam. No obstructive uropathy. The urinary bladder appears normal for degree of distention.  Stomach/Bowel: Small hiatal hernia. The small bowel loops have a normal course and caliber. No obstruction. The appendix is visualized and appears normal. Normal appearance of the proximal colon. There is mild circumferential wall thickening involving the rectum. The distal rectum is unopacified and thus incompletely assessed. This may be better assessed with direct visualization.  Vascular/Lymphatic: Calcified atherosclerotic disease involves the abdominal aorta. No aneurysm. No enlarged retroperitoneal or mesenteric adenopathy. Previous pelvic lymph node dissection. There is a new pelvic lymph node in the left side of the prostatectomy bed which measures 1.4 x 0.9 cm, image 117/series 2.  Reproductive: Previous surgical resection of the prostate gland and seminal vesicles.  Other: No ascites or focal fluid collections identified.  Musculoskeletal: There is been progression of sclerotic bone metastases. Sclerotic lesion involving the L2 vertebra measures 1.6 cm, image 72/ series 603. Previously this measured 5 mm. New sclerotic lesion within the L1 vertebra measures 1.7 cm, image 74 of series 603. Degenerative disc disease is noted at L5-S1.  IMPRESSION: 1. Interval progression of multi focal sclerotic bone metastasis. 2. New lymph node within the left side of the prostatectomy bed. Although non pathologically enlarged this is suspicious and warrants close interval follow-up. 3. Atherosclerotic disease. 4. Stable ground-glass attenuating nodules. These  will require documented stability  for a period of 36 months. This recommendation follows the consensus statement: Recommendations for the Management of Subsolid Pulmonary Nodules Detected at CT: A Statement from the [place]. Radiology [id number].   Electronically Signed   By: Kerby Moors M.D.   On: 05/01/2014 13:13    Medications: I have reviewed the patient's current medications.  Assessment/Plan: 1.Rectal cancer, 1-2 centimeters above the dentate line,uT3N0, invasive adenocarcinoma confirmed on a colonoscopic biopsy 09/03/2012. He began concurrent Xeloda and radiation on 01/13/2013, completed 01/31/2013.   Progress of rectal symptoms, initiation of single agent capecitabine 03/02/2014, 7 days on/7 days off, discontinued after an office visit 04/22/2014  Staging CTs 05/01/2014 with no evidence of distant metastatic rectal cancer 2. Prostate cancer, status post a prostatectomy in 1993, pelvic radiation in 1998 for a rising PSA, subsequent hormonal therapy and now maintained off of specific therapy with a rising PSA. He is followed by Dr. Rosana Hoes.  3. Left lower lobe 13 mm groundglass nodule on a CT of the chest 09/05/2012. Stable lung nodules on the chest CT 05/01/2014. 4. Sclerotic focus in the T6 vertebra on the chest CT 09/05/2012. Progressive sclerotic bone lesions on the CTs 05/01/2014 5. Rectal urgency and bleeding secondary to #1. Improved 6. Anemia secondary to rectal bleeding, prostate cancer, and chemotherapy/radiation-stable  7. 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 He appears asymptomatic from the prostate cancer. There are multiple sclerotic bone lesions, but he does not appear to have a large tumor burden from the prostate cancer. He is symptomatic with rectal bleeding. He reports constipation today. We discussed treatment options including  salvage multi agent chemotherapy regimens. He would like to continue observation with a trial of stool softeners for now. He will begin a stool softener twice daily and daily miralax. Dr. Lorenza Chick will return for an office visit in one month. We will see him sooner as needed.  Betsy Coder, MD  05/01/2014  1:55 PM

## 2014-05-04 ENCOUNTER — Telehealth: Payer: Self-pay | Admitting: Oncology

## 2014-05-04 NOTE — Telephone Encounter (Signed)
no answer.....mailed pt appt sched and letter for [facility]

## 2014-05-29 ENCOUNTER — Ambulatory Visit (HOSPITAL_BASED_OUTPATIENT_CLINIC_OR_DEPARTMENT_OTHER): Payer: Medicare Other | Admitting: Oncology

## 2014-05-29 ENCOUNTER — Telehealth: Payer: Self-pay | Admitting: Oncology

## 2014-05-29 ENCOUNTER — Other Ambulatory Visit (HOSPITAL_BASED_OUTPATIENT_CLINIC_OR_DEPARTMENT_OTHER): Payer: Medicare Other

## 2014-05-29 VITALS — BP 142/60 | HR 72 | Temp 98.2°F | Resp 18 | Ht 71.0 in | Wt 213.7 lb

## 2014-05-29 DIAGNOSIS — R3911 Hesitancy of micturition: Secondary | ICD-10-CM

## 2014-05-29 DIAGNOSIS — C20 Malignant neoplasm of rectum: Secondary | ICD-10-CM

## 2014-05-29 DIAGNOSIS — Z8546 Personal history of malignant neoplasm of prostate: Secondary | ICD-10-CM

## 2014-05-29 DIAGNOSIS — G893 Neoplasm related pain (acute) (chronic): Secondary | ICD-10-CM

## 2014-05-29 LAB — BASIC METABOLIC PANEL (CC13)
ANION GAP: 14 meq/L — AB (ref 3–11)
BUN: 33.3 mg/dL — ABNORMAL HIGH (ref 7.0–26.0)
CALCIUM: 10.1 mg/dL (ref 8.4–10.4)
CO2: 26 mEq/L (ref 22–29)
Chloride: 99 mEq/L (ref 98–109)
Creatinine: 1.9 mg/dL — ABNORMAL HIGH (ref 0.7–1.3)
EGFR: 31 mL/min/{1.73_m2} — AB (ref >90)
Glucose: 112 mg/dl (ref 70–140)
Potassium: 4.4 mEq/L (ref 3.5–5.1)
Sodium: 139 mEq/L (ref 136–145)

## 2014-05-29 LAB — CBC WITH DIFFERENTIAL/PLATELET
BASO%: 0.2 % (ref 0.0–2.0)
Basophils Absolute: 0 10*3/uL (ref 0.0–0.1)
EOS%: 0.2 % (ref 0.0–7.0)
Eosinophils Absolute: 0 10*3/uL (ref 0.0–0.5)
HCT: 33.7 % — ABNORMAL LOW (ref 38.4–49.9)
HGB: 11 g/dL — ABNORMAL LOW (ref 13.0–17.1)
LYMPH%: 15.5 % (ref 14.0–49.0)
MCH: 32.7 pg (ref 27.2–33.4)
MCHC: 32.6 g/dL (ref 32.0–36.0)
MCV: 100.3 fL — AB (ref 79.3–98.0)
MONO#: 1.1 10*3/uL — ABNORMAL HIGH (ref 0.1–0.9)
MONO%: 10.3 % (ref 0.0–14.0)
NEUT#: 8.1 10*3/uL — ABNORMAL HIGH (ref 1.5–6.5)
NEUT%: 73.8 % (ref 39.0–75.0)
PLATELETS: 363 10*3/uL (ref 140–400)
RBC: 3.36 10*6/uL — AB (ref 4.20–5.82)
RDW: 16.8 % — ABNORMAL HIGH (ref 11.0–14.6)
WBC: 10.9 10*3/uL — ABNORMAL HIGH (ref 4.0–10.3)
lymph#: 1.7 10*3/uL (ref 0.9–3.3)

## 2014-05-29 MED ORDER — TRAMADOL HCL 50 MG PO TABS: 50.0000 mg | ORAL_TABLET | Freq: Four times a day (QID) | ORAL | Status: DC | PRN

## 2014-05-29 MED ORDER — POLYETHYLENE GLYCOL 3350 17 G PO PACK: 17.0000 g | PACK | Freq: Every day | ORAL | Status: DC

## 2014-05-29 MED ORDER — SENNOSIDES-DOCUSATE SODIUM 8.6-50 MG PO TABS: 1.0000 | ORAL_TABLET | Freq: Two times a day (BID) | ORAL | Status: AC

## 2014-05-29 NOTE — Patient Instructions (Signed)

## 2014-05-29 NOTE — Telephone Encounter (Signed)
Gave avs & cal for Jan. °

## 2014-05-29 NOTE — Progress Notes (Signed)
  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 He complains of increased pain at the rectum. He feels like he is sitting on a "golf ball ". He has constipation. He also reports incomplete emptying of the bladder. He is able to strain in order to empty the bladder.  Objective:  Vital signs in last 24 hours:  Blood pressure 142/60, pulse 72, temperature 98.2 F (36.8 C), temperature source Oral, resp. rate 18, height 5\' 11"  (1.803 m), weight 213 lb 11.2 oz (96.934 kg), SpO2 97 %.    HEENT: Neck without mass Lymphatics: No cervical, supra-clavicular, axillary, or inguinal nodes Resp: Lungs clear bilaterally Cardio: Regular rate and rhythm GI: No hepatomegaly, no mass. Mild tenderness in the mid abdomen bilaterally Vascular: No leg edema   Lab Results:  Lab Results  Component Value Date   WBC 10.9* 05/29/2014   HGB 11.0* 05/29/2014   HCT 33.7* 05/29/2014   MCV 100.3* 05/29/2014   PLT 363 05/29/2014   NEUTROABS 8.1* 05/29/2014   creatinine 1.9, potassium 4.4, creatine clearance 31    Medications: I have reviewed the patient's current medications.  Assessment/Plan: 1.Rectal cancer, 1-2 centimeters above the dentate line,uT3N0, invasive adenocarcinoma confirmed on a colonoscopic biopsy 09/03/2012. He began concurrent Xeloda and radiation on 01/13/2013, completed 01/31/2013.   Progress of rectal symptoms, initiation of single agent capecitabine 03/02/2014, 7 days on/7 days off, discontinued after an office visit 04/22/2014  Staging CTs 05/01/2014 with no evidence of distant metastatic rectal cancer 2. Prostate cancer, status post a prostatectomy in 1993, pelvic radiation in 1998 for a rising PSA, subsequent hormonal therapy and now maintained off of specific therapy with a rising PSA. He is followed by Dr. Rosana James.  3. Left lower lobe 13 mm groundglass nodule on a CT of the  chest 09/05/2012. Stable lung nodules on the chest CT 05/01/2014. 4. Sclerotic focus in the T6 vertebra on the chest CT 09/05/2012. Progressive sclerotic bone lesions on the CTs 05/01/2014 5. Rectal urgency and bleeding secondary to #1. 6. Anemia secondary to rectal bleeding, prostate cancer, and chemotherapy/radiation-stable  7. Renal insufficiency 8. Pain secondary to rectal cancer 9. 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 We discussed treatment options. He would like to begin a trial of salvage chemotherapy. He has borderline renal function to receive capecitabine. I recommend FOLFOX. The plan is to add Avastin with cycle 2.  We discussed the potential toxicities associated with the FOLFOX regimen including the chance for nausea/vomiting, mucositis, diarrhea, alopecia, and hematologic toxicity. We discussed the hyperpigmentation and hand/foot syndrome associated with 5-fluorouracil. We reviewed the various types of neuropathy seen with oxaliplatin. We discussed the hypertension, bleeding, thromboembolic disease, bowel perforation, and nephrotoxicity associated with Avastin. He agrees to proceed.  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 He will try tramadol for pain.  We will refer him for placement of a Port-A-Cath. A first cycle of FOLFOX is scheduled for 06/11/2014. He will return for an office visit and cycle 2 on 06/25/2013. We will assess his pain, urinary/rectal symptoms, and a repeat rectal exam as measures of tumor response.  Approximately 40 minutes were spent with the patient today. The majority of the time was used for counseling and coordination of care.  Tony Coder, MD  05/29/2014  12:56 PM

## 2014-06-02 ENCOUNTER — Telehealth: Payer: Self-pay | Admitting: *Deleted

## 2014-06-02 NOTE — Telephone Encounter (Signed)
Patient called regarding chemo appointments.  Patient stated that he thought chemo appointments were suppose to be 3 weeks apart instead of 2 weeks.  Informed patient that chemo is every 2 weeks.  Per Dr. Benay Spice.  Patient verbalized understanding.

## 2014-06-07 ENCOUNTER — Other Ambulatory Visit: Payer: Self-pay | Admitting: Oncology

## 2014-06-09 ENCOUNTER — Other Ambulatory Visit: Payer: Self-pay | Admitting: Radiology

## 2014-06-10 ENCOUNTER — Other Ambulatory Visit: Payer: Self-pay | Admitting: Interventional Radiology

## 2014-06-10 ENCOUNTER — Ambulatory Visit (HOSPITAL_COMMUNITY)
Admission: RE | Admit: 2014-06-10 | Discharge: 2014-06-10 | Disposition: A | Discharge status: Home / Self Care | Payer: Medicare Other | Source: Ambulatory Visit | Attending: Oncology | Admitting: Oncology

## 2014-06-10 ENCOUNTER — Other Ambulatory Visit: Payer: Self-pay | Admitting: Oncology

## 2014-06-10 ENCOUNTER — Other Ambulatory Visit: Payer: Self-pay | Admitting: *Deleted

## 2014-06-10 DIAGNOSIS — Z7982 Long term (current) use of aspirin: Secondary | ICD-10-CM | POA: Insufficient documentation

## 2014-06-10 DIAGNOSIS — Z79899 Other long term (current) drug therapy: Secondary | ICD-10-CM | POA: Diagnosis not present

## 2014-06-10 DIAGNOSIS — Z85038 Personal history of other malignant neoplasm of large intestine: Secondary | ICD-10-CM | POA: Diagnosis not present

## 2014-06-10 DIAGNOSIS — I10 Essential (primary) hypertension: Secondary | ICD-10-CM | POA: Insufficient documentation

## 2014-06-10 DIAGNOSIS — Z8546 Personal history of malignant neoplasm of prostate: Secondary | ICD-10-CM | POA: Diagnosis not present

## 2014-06-10 DIAGNOSIS — E780 Pure hypercholesterolemia: Secondary | ICD-10-CM | POA: Insufficient documentation

## 2014-06-10 DIAGNOSIS — C20 Malignant neoplasm of rectum: Secondary | ICD-10-CM | POA: Diagnosis not present

## 2014-06-10 LAB — CBC WITH DIFFERENTIAL/PLATELET
BASOS PCT: 0 % (ref 0–1)
Basophils Absolute: 0 10*3/uL (ref 0.0–0.1)
EOS ABS: 0 10*3/uL (ref 0.0–0.7)
Eosinophils Relative: 0 % (ref 0–5)
HEMATOCRIT: 33.3 % — AB (ref 39.0–52.0)
Hemoglobin: 11 g/dL — ABNORMAL LOW (ref 13.0–17.0)
LYMPHS PCT: 13 % (ref 12–46)
Lymphs Abs: 1.6 10*3/uL (ref 0.7–4.0)
MCH: 33 pg (ref 26.0–34.0)
MCHC: 33 g/dL (ref 30.0–36.0)
MCV: 100 fL (ref 78.0–100.0)
Monocytes Absolute: 1.2 10*3/uL — ABNORMAL HIGH (ref 0.1–1.0)
Monocytes Relative: 10 % (ref 3–12)
Neutro Abs: 9.2 10*3/uL — ABNORMAL HIGH (ref 1.7–7.7)
Neutrophils Relative %: 77 % (ref 43–77)
PLATELETS: 365 10*3/uL (ref 150–400)
RBC: 3.33 MIL/uL — ABNORMAL LOW (ref 4.22–5.81)
RDW: 15.7 % — ABNORMAL HIGH (ref 11.5–15.5)
WBC: 11.9 10*3/uL — ABNORMAL HIGH (ref 4.0–10.5)

## 2014-06-10 LAB — PROTIME-INR
INR: 1.09 (ref 0.00–1.49)
PROTHROMBIN TIME: 14.2 s (ref 11.6–15.2)

## 2014-06-10 LAB — APTT: APTT: 36 s (ref 24–37)

## 2014-06-10 MED ORDER — HEPARIN SOD (PORK) LOCK FLUSH 100 UNIT/ML IV SOLN
INTRAVENOUS | Status: DC
Start: 2014-06-10 — End: 2014-06-11
  Filled 2014-06-10: qty 5

## 2014-06-10 MED ORDER — SODIUM CHLORIDE 0.9 % IV SOLN
INTRAVENOUS | Status: DC
  Administered 2014-06-10: 13:00:00 via INTRAVENOUS

## 2014-06-10 MED ORDER — MIDAZOLAM HCL 2 MG/2ML IJ SOLN
INTRAMUSCULAR | Status: AC | PRN
  Administered 2014-06-10 (×2): 1 mg via INTRAVENOUS

## 2014-06-10 MED ORDER — FENTANYL CITRATE 0.05 MG/ML IJ SOLN
INTRAMUSCULAR | Status: AC
  Filled 2014-06-10: qty 4

## 2014-06-10 MED ORDER — MIDAZOLAM HCL 2 MG/2ML IJ SOLN
INTRAMUSCULAR | Status: AC
  Filled 2014-06-10: qty 4

## 2014-06-10 MED ORDER — LIDOCAINE HCL 1 % IJ SOLN
INTRAMUSCULAR | Status: AC
  Filled 2014-06-10: qty 20

## 2014-06-10 MED ORDER — FENTANYL CITRATE 0.05 MG/ML IJ SOLN
INTRAMUSCULAR | Status: AC | PRN
  Administered 2014-06-10 (×3): 50 ug via INTRAVENOUS

## 2014-06-10 MED ORDER — CEFAZOLIN SODIUM-DEXTROSE 2-3 GM-% IV SOLR: 2.0000 g | Freq: Once | INTRAVENOUS | Status: DC

## 2014-06-10 MED ORDER — LIDOCAINE-PRILOCAINE 2.5-2.5 % EX CREA: TOPICAL_CREAM | CUTANEOUS | Status: AC

## 2014-06-10 MED ORDER — CEFAZOLIN SODIUM-DEXTROSE 2-3 GM-% IV SOLR
INTRAVENOUS | Status: AC
  Administered 2014-06-10: 2000 mg
  Filled 2014-06-10: qty 50

## 2014-06-10 NOTE — Discharge Instructions (Signed)
Implanted Port Insertion, Care After °Refer to this sheet in the next few weeks. These instructions provide you with information on caring for yourself after your procedure. Your health care provider may also give you more specific instructions. Your treatment has been planned according to current medical practices, but problems sometimes occur. Call your health care provider if you have any problems or questions after your procedure. °WHAT TO EXPECT AFTER THE PROCEDURE °After your procedure, it is typical to have the following:  °· Discomfort at the port insertion site. Ice packs to the area will help. °· Bruising on the skin over the port. This will subside in 3-4 days. °HOME CARE INSTRUCTIONS °· After your port is placed, you will get a manufacturer's information card. The card has information about your port. Keep this card with you at all times.   °· Know what kind of port you have. There are many types of ports available.   °· Wear a medical alert bracelet in case of an emergency. This can help alert health care workers that you have a port.   °· The port can stay in for as long as your health care provider believes it is necessary.   °· A home health care nurse may give medicines and take care of the port.   °· You or a family member can get special training and directions for giving medicine and taking care of the port at home.   °SEEK MEDICAL CARE IF:  °· Your port does not flush or you are unable to get a blood return.   °· You have a fever or chills. °SEEK IMMEDIATE MEDICAL CARE IF: °· You have new fluid or pus coming from your incision.   °· You notice a bad smell coming from your incision site.   °· You have swelling, pain, or more redness at the incision or port site.   °· You have chest pain or shortness of breath. °Document Released: 03/19/2013 Document Revised: 06/03/2013 Document Reviewed: 03/19/2013 °Exit[employer]® Patient Information ©2015 [employer]. This information is not intended to replace  advice given to you by your health care provider. Make sure you discuss any questions you have with your health care provider. °Conscious Sedation, Adult, Care After °Refer to this sheet in the next few weeks. These instructions provide you with information on caring for yourself after your procedure. Your health care provider may also give you more specific instructions. Your treatment has been planned according to current medical practices, but problems sometimes occur. Call your health care provider if you have any problems or questions after your procedure. °WHAT TO EXPECT AFTER THE PROCEDURE  °After your procedure: °· You may feel sleepy, clumsy, and have poor balance for several hours. °· Vomiting may occur if you eat too soon after the procedure. °HOME CARE INSTRUCTIONS °· Do not participate in any activities where you could become injured for at least 24 hours. Do not: °¨ Drive. °¨ Swim. °¨ Ride a bicycle. °¨ Operate heavy machinery. °¨ Cook. °¨ Use power tools. °¨ Climb ladders. °¨ Work from a high place. °· Do not make important decisions or sign legal documents until you are improved. °· If you vomit, drink water, juice, or soup when you can drink without vomiting. Make sure you have little or no nausea before eating solid foods. °· Only take over-the-counter or prescription medicines for pain, discomfort, or fever as directed by your health care provider. °· Make sure you and your family fully understand everything about the medicines given to you, including what side effects   may occur. °· You should not drink alcohol, take sleeping pills, or take medicines that cause drowsiness for at least 24 hours. °· If you smoke, do not smoke without supervision. °· If you are feeling better, you may resume normal activities 24 hours after you were sedated. °· Keep all appointments with your health care provider. °SEEK MEDICAL CARE IF: °· Your skin is pale or bluish in color. °· You continue to feel nauseous or  vomit. °· Your pain is getting worse and is not helped by medicine. °· You have bleeding or swelling. °· You are still sleepy or feeling clumsy after 24 hours. °SEEK IMMEDIATE MEDICAL CARE IF: °· You develop a rash. °· You have difficulty breathing. °· You develop any type of allergic problem. °· You have a fever. °MAKE SURE YOU: °· Understand these instructions. °· Will watch your condition. °· Will get help right away if you are not doing well or get worse. °Document Released: 03/19/2013 Document Reviewed: 03/19/2013 °ExitCare® Patient Information ©2015 [employer]. This information is not intended to replace advice given to you by your health care provider. Make sure you discuss any questions you have with your health care provider. ° ° °
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
## 2014-06-10 NOTE — Sedation Documentation (Signed)
Skin care tear to Right ear. Pressure applied and dressing intact

## 2014-06-10 NOTE — Procedures (Signed)
Interventional Radiology Procedure Note  Procedure: Placement of a right IJ approach single lumen PowerPort.  Tip is positioned at the superior cavoatrial junction and catheter is ready for immediate use.  Complications: No immediate Recommendations:  - Ok to shower tomorrow - Do not submerge for 7 days - Routine line care   Signed,  Maddox Bratcher S. Latavia Goga, DO    

## 2014-06-16 ENCOUNTER — Ambulatory Visit (HOSPITAL_BASED_OUTPATIENT_CLINIC_OR_DEPARTMENT_OTHER): Payer: Medicare Other

## 2014-06-16 ENCOUNTER — Ambulatory Visit: Payer: Medicare Other | Admitting: Nutrition

## 2014-06-16 ENCOUNTER — Other Ambulatory Visit (HOSPITAL_BASED_OUTPATIENT_CLINIC_OR_DEPARTMENT_OTHER): Payer: Medicare Other

## 2014-06-16 ENCOUNTER — Encounter: Payer: Self-pay | Admitting: *Deleted

## 2014-06-16 DIAGNOSIS — Z5111 Encounter for antineoplastic chemotherapy: Secondary | ICD-10-CM

## 2014-06-16 DIAGNOSIS — C20 Malignant neoplasm of rectum: Secondary | ICD-10-CM

## 2014-06-16 LAB — CBC WITH DIFFERENTIAL/PLATELET
BASO%: 0.2 % (ref 0.0–2.0)
Basophils Absolute: 0 10*3/uL (ref 0.0–0.1)
EOS%: 0.2 % (ref 0.0–7.0)
Eosinophils Absolute: 0 10*3/uL (ref 0.0–0.5)
HCT: 33.5 % — ABNORMAL LOW (ref 38.4–49.9)
HGB: 10.7 g/dL — ABNORMAL LOW (ref 13.0–17.1)
LYMPH#: 1.1 10*3/uL (ref 0.9–3.3)
LYMPH%: 11.6 % — ABNORMAL LOW (ref 14.0–49.0)
MCH: 32.4 pg (ref 27.2–33.4)
MCHC: 31.9 g/dL — ABNORMAL LOW (ref 32.0–36.0)
MCV: 101.5 fL — ABNORMAL HIGH (ref 79.3–98.0)
MONO#: 0.9 10*3/uL (ref 0.1–0.9)
MONO%: 10.1 % (ref 0.0–14.0)
NEUT%: 77.9 % — AB (ref 39.0–75.0)
NEUTROS ABS: 7.2 10*3/uL — AB (ref 1.5–6.5)
Platelets: 344 10*3/uL (ref 140–400)
RBC: 3.3 10*6/uL — ABNORMAL LOW (ref 4.20–5.82)
RDW: 15.3 % — AB (ref 11.0–14.6)
WBC: 9.3 10*3/uL (ref 4.0–10.3)

## 2014-06-16 LAB — COMPREHENSIVE METABOLIC PANEL (CC13)
ALT: 9 U/L (ref 0–55)
ANION GAP: 12 meq/L — AB (ref 3–11)
AST: 18 U/L (ref 5–34)
Albumin: 2.9 g/dL — ABNORMAL LOW (ref 3.5–5.0)
Alkaline Phosphatase: 85 U/L (ref 40–150)
BILIRUBIN TOTAL: 0.3 mg/dL (ref 0.20–1.20)
BUN: 23.7 mg/dL (ref 7.0–26.0)
CALCIUM: 9.8 mg/dL (ref 8.4–10.4)
CHLORIDE: 98 meq/L (ref 98–109)
CO2: 30 mEq/L — ABNORMAL HIGH (ref 22–29)
Creatinine: 1.5 mg/dL — ABNORMAL HIGH (ref 0.7–1.3)
EGFR: 40 mL/min/{1.73_m2} — ABNORMAL LOW (ref >90)
Glucose: 165 mg/dl — ABNORMAL HIGH (ref 70–140)
Potassium: 3.8 mEq/L (ref 3.5–5.1)
SODIUM: 140 meq/L (ref 136–145)
TOTAL PROTEIN: 6.7 g/dL (ref 6.4–8.3)

## 2014-06-16 MED ORDER — DEXTROSE 5 % IV SOLN
400.0000 mg/m2 | Freq: Once | INTRAVENOUS | Status: AC
  Administered 2014-06-16: 880 mg via INTRAVENOUS
  Filled 2014-06-16: qty 44

## 2014-06-16 MED ORDER — OXALIPLATIN CHEMO INJECTION 100 MG/20ML
85.0000 mg/m2 | Freq: Once | INTRAVENOUS | Status: AC
  Administered 2014-06-16: 185 mg via INTRAVENOUS
  Filled 2014-06-16: qty 37

## 2014-06-16 MED ORDER — ONDANSETRON 8 MG/50ML IVPB (CHCC)
8.0000 mg | Freq: Once | INTRAVENOUS | Status: AC
  Administered 2014-06-16: 8 mg via INTRAVENOUS

## 2014-06-16 MED ORDER — DEXAMETHASONE SODIUM PHOSPHATE 10 MG/ML IJ SOLN
10.0000 mg | Freq: Once | INTRAMUSCULAR | Status: AC
  Administered 2014-06-16: 10 mg via INTRAVENOUS

## 2014-06-16 MED ORDER — SODIUM CHLORIDE 0.9 % IV SOLN
2400.0000 mg/m2 | INTRAVENOUS | Status: DC
  Administered 2014-06-16: 5300 mg via INTRAVENOUS
  Filled 2014-06-16: qty 106

## 2014-06-16 MED ORDER — FLUOROURACIL CHEMO INJECTION 2.5 GM/50ML
400.0000 mg/m2 | Freq: Once | INTRAVENOUS | Status: AC
  Administered 2014-06-16: 900 mg via INTRAVENOUS
  Filled 2014-06-16: qty 18

## 2014-06-16 MED ORDER — ONDANSETRON 8 MG/NS 50 ML IVPB
INTRAVENOUS | Status: AC
Start: 2014-06-16 — End: 2014-06-16
  Filled 2014-06-16: qty 8

## 2014-06-16 MED ORDER — DEXTROSE 5 % IV SOLN
Freq: Once | INTRAVENOUS | Status: AC
  Administered 2014-06-16: 11:00:00 via INTRAVENOUS

## 2014-06-16 MED ORDER — DEXAMETHASONE SODIUM PHOSPHATE 10 MG/ML IJ SOLN
INTRAMUSCULAR | Status: AC
  Filled 2014-06-16: qty 1

## 2014-06-16 NOTE — Patient Instructions (Signed)
[facility] Discharge Instructions for Patients Receiving Chemotherapy  Today you received the following chemotherapy agents Oxaliplatin, Leucovorin and 5FU.  To help prevent nausea and vomiting after your treatment, we encourage you to take your nausea medication.   If you develop nausea and vomiting that is not controlled by your nausea medication, call the clinic.   BELOW ARE SYMPTOMS THAT SHOULD BE REPORTED IMMEDIATELY:  *FEVER GREATER THAN 100.5 F  *CHILLS WITH OR WITHOUT FEVER  NAUSEA AND VOMITING THAT IS NOT CONTROLLED WITH YOUR NAUSEA MEDICATION  *UNUSUAL SHORTNESS OF BREATH  *UNUSUAL BRUISING OR BLEEDING  TENDERNESS IN MOUTH AND THROAT WITH OR WITHOUT PRESENCE OF ULCERS  *URINARY PROBLEMS  *BOWEL PROBLEMS  UNUSUAL RASH Items with * indicate a potential emergency and should be followed up as soon as possible.  Feel free to call the clinic you have any questions or concerns. The clinic phone number is [phone].

## 2014-06-16 NOTE — Progress Notes (Signed)
Patient was identified to be at risk for malnutrition on the MST secondary to poor appetite and weight loss.    81 year old male diagnosed with rectal cancer in 2014.  He is a patient of Dr. Benay Spice.  Past medical history includes prostate cancer, anemia, and renal insufficiency.  Medications include vitamin D, MiraLAX, Compazine, and Senokot-S.  Labs include BUN 33.9, creatinine 2.0, and albumin 3.2.  Height: 5 feet 11 inches. Weight: 213.7 pounds December 18. Usual body weight: 233 pounds July 2015. BMI: 29.82.  Patient reports he does have a poor appetite along with taste alterations. He has had occasional nausea. Patient reports he stays out of the dining room at [facility] secondary to nausea. Patient enjoys the food at [employer] and feels like he eats adequate calories and protein. Patient is "delighted with weight loss".  Nutrition diagnosis:  Food and nutrition related knowledge deficit related to diagnosis of rectal cancer and associated treatments as evidenced by no prior need for nutrition related information.  Intervention: Educated patient on strategies for improving taste.  Provided fact sheet on altered taste. Educated patient to consume adequate calories and protein for minimal weight loss.  Provided fact sheet on increasing calories and protein. Encouraged patient to consume small frequent meals and snacks for improved appetite. Questions answered.  Teach back method used.  Monitoring, evaluation, goals: Patient will tolerate adequate calories and protein to minimize weight loss.  Next visit: Will be scheduled as needed.  **Disclaimer: This note was dictated with voice recognition software. Similar sounding words can inadvertently be transcribed and this note may contain transcription errors which may not have been corrected upon publication of note.**

## 2014-06-17 ENCOUNTER — Telehealth: Payer: Self-pay | Admitting: *Deleted

## 2014-06-17 NOTE — Telephone Encounter (Signed)
Spoke with pt today for post chemo follow up.  Pt stated he is doing very well.  Denied nausea/vomiting, bowel and bladder function fine, very good appetite and drinking lots of fluids as tolerated.  Denied pain.  Pt aware of next returned appts.  Pt understood to call office with any new problems.

## 2014-06-18 ENCOUNTER — Ambulatory Visit (HOSPITAL_BASED_OUTPATIENT_CLINIC_OR_DEPARTMENT_OTHER): Payer: Medicare Other

## 2014-06-18 DIAGNOSIS — Z452 Encounter for adjustment and management of vascular access device: Secondary | ICD-10-CM

## 2014-06-18 DIAGNOSIS — C20 Malignant neoplasm of rectum: Secondary | ICD-10-CM

## 2014-06-18 MED ORDER — HEPARIN SOD (PORK) LOCK FLUSH 100 UNIT/ML IV SOLN
500.0000 [IU] | Freq: Once | INTRAVENOUS | Status: AC | PRN
  Administered 2014-06-18: 500 [IU]
  Filled 2014-06-18: qty 5

## 2014-06-18 MED ORDER — SODIUM CHLORIDE 0.9 % IJ SOLN
10.0000 mL | INTRAMUSCULAR | Status: DC | PRN
  Administered 2014-06-18: 10 mL
  Filled 2014-06-18: qty 10

## 2014-06-18 NOTE — Patient Instructions (Signed)

## 2014-06-28 ENCOUNTER — Other Ambulatory Visit: Payer: Self-pay | Admitting: Oncology

## 2014-06-30 ENCOUNTER — Other Ambulatory Visit (HOSPITAL_BASED_OUTPATIENT_CLINIC_OR_DEPARTMENT_OTHER): Payer: Medicare Other

## 2014-06-30 ENCOUNTER — Ambulatory Visit: Payer: Medicare Other

## 2014-06-30 ENCOUNTER — Ambulatory Visit (HOSPITAL_BASED_OUTPATIENT_CLINIC_OR_DEPARTMENT_OTHER): Payer: Medicare Other | Admitting: Oncology

## 2014-06-30 ENCOUNTER — Telehealth: Payer: Self-pay | Admitting: *Deleted

## 2014-06-30 ENCOUNTER — Telehealth: Payer: Self-pay | Admitting: Oncology

## 2014-06-30 VITALS — BP 144/55 | HR 80 | Temp 97.5°F | Resp 18 | Ht 71.0 in | Wt 207.8 lb

## 2014-06-30 DIAGNOSIS — Z8546 Personal history of malignant neoplasm of prostate: Secondary | ICD-10-CM

## 2014-06-30 DIAGNOSIS — R3911 Hesitancy of micturition: Secondary | ICD-10-CM

## 2014-06-30 DIAGNOSIS — C20 Malignant neoplasm of rectum: Secondary | ICD-10-CM

## 2014-06-30 DIAGNOSIS — D649 Anemia, unspecified: Secondary | ICD-10-CM

## 2014-06-30 DIAGNOSIS — G893 Neoplasm related pain (acute) (chronic): Secondary | ICD-10-CM

## 2014-06-30 DIAGNOSIS — N289 Disorder of kidney and ureter, unspecified: Secondary | ICD-10-CM

## 2014-06-30 DIAGNOSIS — D701 Agranulocytosis secondary to cancer chemotherapy: Secondary | ICD-10-CM

## 2014-06-30 LAB — CBC WITH DIFFERENTIAL/PLATELET
BASO%: 1 % (ref 0.0–2.0)
BASOS ABS: 0 10*3/uL (ref 0.0–0.1)
EOS%: 0.5 % (ref 0.0–7.0)
Eosinophils Absolute: 0 10*3/uL (ref 0.0–0.5)
HCT: 30.9 % — ABNORMAL LOW (ref 38.4–49.9)
HGB: 10.3 g/dL — ABNORMAL LOW (ref 13.0–17.1)
LYMPH%: 46.4 % (ref 14.0–49.0)
MCH: 32.7 pg (ref 27.2–33.4)
MCHC: 33.3 g/dL (ref 32.0–36.0)
MCV: 98.1 fL — ABNORMAL HIGH (ref 79.3–98.0)
MONO#: 0.5 10*3/uL (ref 0.1–0.9)
MONO%: 26 % — AB (ref 0.0–14.0)
NEUT#: 0.5 10*3/uL — CL (ref 1.5–6.5)
NEUT%: 26.1 % — AB (ref 39.0–75.0)
Platelets: 252 10*3/uL (ref 140–400)
RBC: 3.15 10*6/uL — ABNORMAL LOW (ref 4.20–5.82)
RDW: 15.2 % — ABNORMAL HIGH (ref 11.0–14.6)
WBC: 2 10*3/uL — AB (ref 4.0–10.3)
lymph#: 0.9 10*3/uL (ref 0.9–3.3)
nRBC: 0 % (ref 0–0)

## 2014-06-30 LAB — COMPREHENSIVE METABOLIC PANEL (CC13)
ALBUMIN: 2.8 g/dL — AB (ref 3.5–5.0)
ALK PHOS: 90 U/L (ref 40–150)
ALT: 14 U/L (ref 0–55)
ANION GAP: 13 meq/L — AB (ref 3–11)
AST: 23 U/L (ref 5–34)
BILIRUBIN TOTAL: 0.32 mg/dL (ref 0.20–1.20)
BUN: 25.8 mg/dL (ref 7.0–26.0)
CALCIUM: 9.2 mg/dL (ref 8.4–10.4)
CHLORIDE: 97 meq/L — AB (ref 98–109)
CO2: 26 mEq/L (ref 22–29)
CREATININE: 1.6 mg/dL — AB (ref 0.7–1.3)
EGFR: 39 mL/min/{1.73_m2} — ABNORMAL LOW (ref >90)
Glucose: 119 mg/dl (ref 70–140)
POTASSIUM: 3.5 meq/L (ref 3.5–5.1)
Sodium: 137 mEq/L (ref 136–145)
TOTAL PROTEIN: 6.6 g/dL (ref 6.4–8.3)

## 2014-06-30 NOTE — Telephone Encounter (Signed)
Note to scheduler, Valda Lamb to please get the 1/22 lab scheduled and call patient.

## 2014-06-30 NOTE — Telephone Encounter (Signed)
Pt confirmed labs/ov per 01/19 POF, gave pt AVS..... KJ, cancel chemo today sent msg to r/s from today to 01/27...Marland KitchenMarland Kitchen

## 2014-06-30 NOTE — Telephone Encounter (Signed)
NO NOTE

## 2014-06-30 NOTE — Progress Notes (Signed)
  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 He completed a first treatment with FOLFOX 06/16/2014. He did not have significant nausea following chemotherapy. Cold sensitivity has resolved. He complains of malaise. He has diarrhea several times per day. He discontinue MiraLAX, but is taking a stool softener. The low abdominal discomfort he had prior to chemotherapy has resolved. No rectal pain. He continues to have a frequent rectal discharge. He reports resolution of urinary hesitancy.  Objective:  Vital signs in last 24 hours:  Blood pressure 144/55, pulse 80, temperature 97.5 F (36.4 C), temperature source Oral, resp. rate 18, height 5\' 11"  (1.803 m), weight 207 lb 12.8 oz (94.257 kg), SpO2 100 %.    HEENT: No thrush or ulcers Resp: Lungs clear bilaterally Cardio: Regular rate and rhythm GI: No hepatomegaly, nontender, no mass Vascular: No leg edema  Skin: Palms without erythema   Portacath/PICC-without erythema  Lab Results:  Lab Results  Component Value Date   WBC 2.0* 06/30/2014   HGB 10.3* 06/30/2014   HCT 30.9* 06/30/2014   MCV 98.1* 06/30/2014   PLT 252 06/30/2014   NEUTROABS 0.5* 06/30/2014   Potassium 3.5, creatinine 1.6, BUN 25.8  Labs from [facility] on 06/29/2014: Creatinine 1.5, ANC 1.2 Medications: I have reviewed the patient's current medications.  Assessment/Plan: 1.Rectal cancer, 1-2 centimeters above the dentate line,uT3N0, invasive adenocarcinoma confirmed on a colonoscopic biopsy 09/03/2012. He began concurrent Xeloda and radiation on 01/13/2013, completed 01/31/2013.   Progress of rectal symptoms, initiation of single agent capecitabine 03/02/2014, 7 days on/7 days off, discontinued after an office visit 04/22/2014  Staging CTs 05/01/2014 with no evidence of distant metastatic rectal cancer  Cycle 1 FOLFOX 06/16/2014 2. Prostate cancer, status post a  prostatectomy in 1993, pelvic radiation in 1998 for a rising PSA, subsequent hormonal therapy and now maintained off of specific therapy with a rising PSA. He is followed by Dr. Rosana Hoes.  3. Left lower lobe 13 mm groundglass nodule on a CT of the chest 09/05/2012. Stable lung nodules on the chest CT 05/01/2014. 4. Sclerotic focus in the T6 vertebra on the chest CT 09/05/2012. Progressive sclerotic bone lesions on the CTs 05/01/2014 5. Rectal urgency and bleeding secondary to #1. 6. Anemia secondary to rectal bleeding, prostate cancer, and chemotherapy/radiation-stable  7. Renal insufficiency 8. Pain secondary to rectal cancer 9. Urinary hesitancy-likely secondary to rectal cancer 10. 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 He has neutropenia today. Chemotherapy will be held until 07/08/2014. Hopefully the improvement in his abdominal and rectal pain is related to a response from chemotherapy.  He knows to contact us for a fever. He will return for a CBC 07/03/2014. Dr. Lorenza Chick will receive Neulasta with cycle 2. We reviewed the potential toxicities associated with Neulasta including the chance for bone pain, rash, and splenic rupture.  The plan is complete for 4-5 cycles of FOLFOX prior to a restaging evaluation.  Betsy Coder, MD  06/30/2014  8:57 AM

## 2014-07-01 ENCOUNTER — Telehealth: Payer: Self-pay | Admitting: *Deleted

## 2014-07-01 ENCOUNTER — Emergency Department (HOSPITAL_COMMUNITY): Payer: Medicare Other

## 2014-07-01 ENCOUNTER — Encounter (HOSPITAL_COMMUNITY): Payer: Self-pay | Admitting: Emergency Medicine

## 2014-07-01 ENCOUNTER — Inpatient Hospital Stay (HOSPITAL_COMMUNITY)
Admission: EM | Admit: 2014-07-01 | Discharge: 2014-07-02 | DRG: 543 | Disposition: A | Discharge status: Nursing Home (Includes ICF) | Payer: Medicare Other | Attending: Internal Medicine | Admitting: Internal Medicine

## 2014-07-01 ENCOUNTER — Encounter: Payer: Medicare Other | Admitting: Nurse Practitioner

## 2014-07-01 DIAGNOSIS — C61 Malignant neoplasm of prostate: Secondary | ICD-10-CM | POA: Diagnosis present

## 2014-07-01 DIAGNOSIS — C7951 Secondary malignant neoplasm of bone: Secondary | ICD-10-CM | POA: Diagnosis present

## 2014-07-01 DIAGNOSIS — D701 Agranulocytosis secondary to cancer chemotherapy: Secondary | ICD-10-CM | POA: Diagnosis present

## 2014-07-01 DIAGNOSIS — N189 Chronic kidney disease, unspecified: Secondary | ICD-10-CM | POA: Diagnosis present

## 2014-07-01 DIAGNOSIS — Z7982 Long term (current) use of aspirin: Secondary | ICD-10-CM

## 2014-07-01 DIAGNOSIS — E871 Hypo-osmolality and hyponatremia: Secondary | ICD-10-CM | POA: Diagnosis present

## 2014-07-01 DIAGNOSIS — Z66 Do not resuscitate: Secondary | ICD-10-CM | POA: Diagnosis present

## 2014-07-01 DIAGNOSIS — Z85038 Personal history of other malignant neoplasm of large intestine: Secondary | ICD-10-CM | POA: Diagnosis not present

## 2014-07-01 DIAGNOSIS — M79605 Pain in left leg: Secondary | ICD-10-CM | POA: Diagnosis present

## 2014-07-01 DIAGNOSIS — Z923 Personal history of irradiation: Secondary | ICD-10-CM

## 2014-07-01 DIAGNOSIS — Z6829 Body mass index (BMI) 29.0-29.9, adult: Secondary | ICD-10-CM

## 2014-07-01 DIAGNOSIS — M545 Low back pain: Secondary | ICD-10-CM | POA: Diagnosis present

## 2014-07-01 DIAGNOSIS — E440 Moderate protein-calorie malnutrition: Secondary | ICD-10-CM | POA: Diagnosis present

## 2014-07-01 DIAGNOSIS — K5900 Constipation, unspecified: Secondary | ICD-10-CM | POA: Diagnosis present

## 2014-07-01 DIAGNOSIS — T451X5A Adverse effect of antineoplastic and immunosuppressive drugs, initial encounter: Secondary | ICD-10-CM | POA: Diagnosis present

## 2014-07-01 DIAGNOSIS — W19XXXA Unspecified fall, initial encounter: Secondary | ICD-10-CM | POA: Diagnosis present

## 2014-07-01 DIAGNOSIS — Z8249 Family history of ischemic heart disease and other diseases of the circulatory system: Secondary | ICD-10-CM | POA: Diagnosis not present

## 2014-07-01 DIAGNOSIS — D500 Iron deficiency anemia secondary to blood loss (chronic): Secondary | ICD-10-CM | POA: Diagnosis present

## 2014-07-01 DIAGNOSIS — M5410 Radiculopathy, site unspecified: Secondary | ICD-10-CM

## 2014-07-01 DIAGNOSIS — R3911 Hesitancy of micturition: Secondary | ICD-10-CM | POA: Diagnosis present

## 2014-07-01 DIAGNOSIS — I1310 Hypertensive heart and chronic kidney disease without heart failure, with stage 1 through stage 4 chronic kidney disease, or unspecified chronic kidney disease: Secondary | ICD-10-CM | POA: Diagnosis present

## 2014-07-01 DIAGNOSIS — Z79899 Other long term (current) drug therapy: Secondary | ICD-10-CM | POA: Diagnosis not present

## 2014-07-01 DIAGNOSIS — M79604 Pain in right leg: Secondary | ICD-10-CM | POA: Diagnosis present

## 2014-07-01 DIAGNOSIS — Z85828 Personal history of other malignant neoplasm of skin: Secondary | ICD-10-CM

## 2014-07-01 DIAGNOSIS — M5137 Other intervertebral disc degeneration, lumbosacral region: Secondary | ICD-10-CM | POA: Diagnosis present

## 2014-07-01 DIAGNOSIS — E780 Pure hypercholesterolemia: Secondary | ICD-10-CM | POA: Diagnosis present

## 2014-07-01 DIAGNOSIS — C19 Malignant neoplasm of rectosigmoid junction: Secondary | ICD-10-CM | POA: Diagnosis present

## 2014-07-01 DIAGNOSIS — E876 Hypokalemia: Secondary | ICD-10-CM | POA: Diagnosis present

## 2014-07-01 DIAGNOSIS — M549 Dorsalgia, unspecified: Secondary | ICD-10-CM

## 2014-07-01 LAB — URINALYSIS, ROUTINE W REFLEX MICROSCOPIC
GLUCOSE, UA: NEGATIVE mg/dL
Hgb urine dipstick: NEGATIVE
Ketones, ur: NEGATIVE mg/dL
LEUKOCYTES UA: NEGATIVE
Nitrite: NEGATIVE
PH: 5 (ref 5.0–8.0)
PROTEIN: 30 mg/dL — AB
Specific Gravity, Urine: 1.022 (ref 1.005–1.030)
UROBILINOGEN UA: 1 mg/dL (ref 0.0–1.0)

## 2014-07-01 LAB — URINE MICROSCOPIC-ADD ON

## 2014-07-01 LAB — CBC WITH DIFFERENTIAL/PLATELET
Basophils Absolute: 0 10*3/uL (ref 0.0–0.1)
Basophils Relative: 1 % (ref 0–1)
EOS ABS: 0 10*3/uL (ref 0.0–0.7)
Eosinophils Relative: 0 % (ref 0–5)
HCT: 29.3 % — ABNORMAL LOW (ref 39.0–52.0)
Hemoglobin: 9.9 g/dL — ABNORMAL LOW (ref 13.0–17.0)
LYMPHS ABS: 0.5 10*3/uL — AB (ref 0.7–4.0)
Lymphocytes Relative: 25 % (ref 12–46)
MCH: 33.1 pg (ref 26.0–34.0)
MCHC: 33.8 g/dL (ref 30.0–36.0)
MCV: 98 fL (ref 78.0–100.0)
MONO ABS: 1.1 10*3/uL — AB (ref 0.1–1.0)
Monocytes Relative: 50 % — ABNORMAL HIGH (ref 3–12)
Neutro Abs: 0.5 10*3/uL — ABNORMAL LOW (ref 1.7–7.7)
Neutrophils Relative %: 24 % — ABNORMAL LOW (ref 43–77)
PLATELETS: 278 10*3/uL (ref 150–400)
RBC: 2.99 MIL/uL — ABNORMAL LOW (ref 4.22–5.81)
RDW: 15.2 % (ref 11.5–15.5)
WBC: 2.1 10*3/uL — ABNORMAL LOW (ref 4.0–10.5)

## 2014-07-01 LAB — MAGNESIUM: MAGNESIUM: 1.1 mg/dL — AB (ref 1.5–2.5)

## 2014-07-01 LAB — BASIC METABOLIC PANEL
Anion gap: 13 (ref 5–15)
BUN: 29 mg/dL — ABNORMAL HIGH (ref 6–23)
CO2: 25 mmol/L (ref 19–32)
Calcium: 9.2 mg/dL (ref 8.4–10.5)
Chloride: 93 mEq/L — ABNORMAL LOW (ref 96–112)
Creatinine, Ser: 1.81 mg/dL — ABNORMAL HIGH (ref 0.50–1.35)
GFR calc Af Amer: 36 mL/min — ABNORMAL LOW (ref >90)
GFR calc non Af Amer: 31 mL/min — ABNORMAL LOW (ref >90)
GLUCOSE: 140 mg/dL — AB (ref 70–99)
Potassium: 3.2 mmol/L — ABNORMAL LOW (ref 3.5–5.1)
SODIUM: 131 mmol/L — AB (ref 135–145)

## 2014-07-01 LAB — PHOSPHORUS: Phosphorus: 3.2 mg/dL (ref 2.3–4.6)

## 2014-07-01 MED ORDER — POLYETHYLENE GLYCOL 3350 17 G PO PACK: 17.0000 g | PACK | Freq: Every day | ORAL | Status: DC | PRN

## 2014-07-01 MED ORDER — HYDROMORPHONE HCL 1 MG/ML IJ SOLN
1.0000 mg | Freq: Once | INTRAMUSCULAR | Status: AC
  Administered 2014-07-01: 1 mg via INTRAVENOUS
  Filled 2014-07-01: qty 1

## 2014-07-01 MED ORDER — ONDANSETRON HCL 4 MG PO TABS: 4.0000 mg | ORAL_TABLET | Freq: Four times a day (QID) | ORAL | Status: DC | PRN

## 2014-07-01 MED ORDER — ONDANSETRON HCL 4 MG/2ML IJ SOLN
4.0000 mg | Freq: Once | INTRAMUSCULAR | Status: AC
  Administered 2014-07-01: 4 mg via INTRAVENOUS
  Filled 2014-07-01: qty 2

## 2014-07-01 MED ORDER — ATENOLOL 50 MG PO TABS
50.0000 mg | ORAL_TABLET | Freq: Every day | ORAL | Status: DC
  Administered 2014-07-01: 50 mg via ORAL
  Filled 2014-07-01 (×2): qty 1

## 2014-07-01 MED ORDER — OXYCODONE-ACETAMINOPHEN 5-325 MG PO TABS
1.0000 | ORAL_TABLET | ORAL | Status: DC | PRN
  Administered 2014-07-01: 1 via ORAL
  Administered 2014-07-02: 2 via ORAL
  Filled 2014-07-01: qty 1
  Filled 2014-07-01: qty 2

## 2014-07-01 MED ORDER — ACETAMINOPHEN 650 MG RE SUPP: 650.0000 mg | Freq: Four times a day (QID) | RECTAL | Status: DC | PRN

## 2014-07-01 MED ORDER — ENOXAPARIN SODIUM 40 MG/0.4ML ~~LOC~~ SOLN
40.0000 mg | SUBCUTANEOUS | Status: DC
  Administered 2014-07-01: 40 mg via SUBCUTANEOUS
  Filled 2014-07-01 (×2): qty 0.4

## 2014-07-01 MED ORDER — ONDANSETRON HCL 4 MG/2ML IJ SOLN: 4.0000 mg | Freq: Four times a day (QID) | INTRAMUSCULAR | Status: DC | PRN

## 2014-07-01 MED ORDER — POTASSIUM CHLORIDE CRYS ER 20 MEQ PO TBCR
20.0000 meq | EXTENDED_RELEASE_TABLET | Freq: Once | ORAL | Status: AC
  Administered 2014-07-01: 20 meq via ORAL
  Filled 2014-07-01: qty 1

## 2014-07-01 MED ORDER — FELODIPINE ER 5 MG PO TB24
5.0000 mg | ORAL_TABLET | Freq: Every morning | ORAL | Status: DC
  Administered 2014-07-02: 5 mg via ORAL
  Filled 2014-07-01: qty 1

## 2014-07-01 MED ORDER — SENNOSIDES-DOCUSATE SODIUM 8.6-50 MG PO TABS
1.0000 | ORAL_TABLET | Freq: Two times a day (BID) | ORAL | Status: DC
  Administered 2014-07-01 – 2014-07-02 (×2): 1 via ORAL
  Filled 2014-07-01 (×3): qty 1

## 2014-07-01 MED ORDER — HYDROMORPHONE HCL 1 MG/ML IJ SOLN: 1.0000 mg | INTRAMUSCULAR | Status: DC | PRN

## 2014-07-01 MED ORDER — PROCHLORPERAZINE MALEATE 10 MG PO TABS: 5.0000 mg | ORAL_TABLET | Freq: Four times a day (QID) | ORAL | Status: DC | PRN

## 2014-07-01 MED ORDER — HYDROMORPHONE HCL 1 MG/ML IJ SOLN
1.0000 mg | INTRAMUSCULAR | Status: DC | PRN
  Administered 2014-07-01: 1 mg via INTRAVENOUS
  Filled 2014-07-01: qty 1

## 2014-07-01 MED ORDER — DIAZEPAM 5 MG/ML IJ SOLN
2.5000 mg | Freq: Once | INTRAMUSCULAR | Status: AC
  Administered 2014-07-01: 2.5 mg via INTRAVENOUS
  Filled 2014-07-01: qty 2

## 2014-07-01 MED ORDER — FENTANYL CITRATE 0.05 MG/ML IJ SOLN
50.0000 ug | Freq: Once | INTRAMUSCULAR | Status: DC
  Filled 2014-07-01: qty 2

## 2014-07-01 MED ORDER — ASPIRIN EC 81 MG PO TBEC
81.0000 mg | DELAYED_RELEASE_TABLET | Freq: Every day | ORAL | Status: DC
  Administered 2014-07-02: 81 mg via ORAL
  Filled 2014-07-01: qty 1

## 2014-07-01 MED ORDER — SODIUM CHLORIDE 0.9 % IJ SOLN
3.0000 mL | Freq: Two times a day (BID) | INTRAMUSCULAR | Status: DC
  Administered 2014-07-01 – 2014-07-02 (×2): 3 mL via INTRAVENOUS

## 2014-07-01 MED ORDER — ACETAMINOPHEN 325 MG PO TABS: 650.0000 mg | ORAL_TABLET | Freq: Four times a day (QID) | ORAL | Status: DC | PRN

## 2014-07-01 MED ORDER — MORPHINE SULFATE 4 MG/ML IJ SOLN
4.0000 mg | Freq: Once | INTRAMUSCULAR | Status: AC
  Administered 2014-07-01: 4 mg via INTRAVENOUS
  Filled 2014-07-01: qty 1

## 2014-07-01 NOTE — Telephone Encounter (Signed)
Pt called this AM stating onset of pain post MD appointment.  " screaming pain in my joints "  Pt states he took tramadol and rested with some relief but this am pain has returned.  Bravery denies any changes in his bowels or bladder habits.  Pt is constant with exacerbations " like waves ".  While on the phone pt had to stop talking - and had to moan for a few moments".  Per further discussion with his daughter and possibility of coming in to symptom management for assessment- she states " he would need to be seen immediately he is in so much pain ".  This RN informed daughter due to crisis of pain - best for pt to proceed to the ER for more immediate attention.  Daughter will proceed and take father to [facility] ER.  This RN called [facility] ER and informed charge nurse of pending arrival.  THIS NOTE WILL BE SENT TO MD AND RN AT [facility].

## 2014-07-01 NOTE — Telephone Encounter (Signed)
Per staff message and POF I have scheduled appts. Advised scheduler of appts and to move kabs . JMW

## 2014-07-01 NOTE — ED Notes (Signed)
Tony James from MRI called to state patient was unable to tolerate laying flat due to pain.  Order for Dilaudid 1 mg obtained from [place].  Administered medication in MRI machine.

## 2014-07-01 NOTE — Progress Notes (Signed)
Received report from Ed RN, Pt arrived unit accompanied by family. Pt is alert and responsive, able to communicate needs. MD notified of pt's location, will continue with current plan of care.

## 2014-07-01 NOTE — ED Notes (Signed)
Patient c/o low back pain radiating to hips x2 days.  Patient believes pain is related to his enlarged prostate.  Patient also being treated for rectal cancer.  Patient denies N/V/D, fever, chest pain and SOB.

## 2014-07-01 NOTE — Telephone Encounter (Signed)
Patient's granddaughter Yaden Seith called requesting Dr. Benay Spice for update on patient who is in the ER.  Advised to speak with ER provider about his care.

## 2014-07-01 NOTE — ED Notes (Signed)
Patient transported to MRI 
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
## 2014-07-01 NOTE — ED Notes (Signed)
Per pt, states B/L leg pain which started last night-has multiple tumors on lumbar spine-took tramadol without relief
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
## 2014-07-02 ENCOUNTER — Inpatient Hospital Stay (HOSPITAL_COMMUNITY): Payer: Medicare Other

## 2014-07-02 DIAGNOSIS — D500 Iron deficiency anemia secondary to blood loss (chronic): Secondary | ICD-10-CM

## 2014-07-02 DIAGNOSIS — C20 Malignant neoplasm of rectum: Secondary | ICD-10-CM

## 2014-07-02 DIAGNOSIS — R918 Other nonspecific abnormal finding of lung field: Secondary | ICD-10-CM

## 2014-07-02 DIAGNOSIS — M899 Disorder of bone, unspecified: Secondary | ICD-10-CM

## 2014-07-02 DIAGNOSIS — D702 Other drug-induced agranulocytosis: Secondary | ICD-10-CM

## 2014-07-02 DIAGNOSIS — C61 Malignant neoplasm of prostate: Secondary | ICD-10-CM

## 2014-07-02 DIAGNOSIS — D6481 Anemia due to antineoplastic chemotherapy: Secondary | ICD-10-CM

## 2014-07-02 DIAGNOSIS — R3911 Hesitancy of micturition: Secondary | ICD-10-CM

## 2014-07-02 DIAGNOSIS — G893 Neoplasm related pain (acute) (chronic): Secondary | ICD-10-CM

## 2014-07-02 DIAGNOSIS — K625 Hemorrhage of anus and rectum: Secondary | ICD-10-CM

## 2014-07-02 DIAGNOSIS — R152 Fecal urgency: Secondary | ICD-10-CM

## 2014-07-02 DIAGNOSIS — W19XXXA Unspecified fall, initial encounter: Secondary | ICD-10-CM

## 2014-07-02 DIAGNOSIS — D630 Anemia in neoplastic disease: Secondary | ICD-10-CM

## 2014-07-02 LAB — CBC WITH DIFFERENTIAL/PLATELET
BASOS PCT: 0 % (ref 0–1)
Basophils Absolute: 0 10*3/uL (ref 0.0–0.1)
EOS PCT: 0 % (ref 0–5)
Eosinophils Absolute: 0 10*3/uL (ref 0.0–0.7)
HCT: 25.2 % — ABNORMAL LOW (ref 39.0–52.0)
HEMOGLOBIN: 8.5 g/dL — AB (ref 13.0–17.0)
LYMPHS ABS: 0.7 10*3/uL (ref 0.7–4.0)
Lymphocytes Relative: 22 % (ref 12–46)
MCH: 32.9 pg (ref 26.0–34.0)
MCHC: 33.7 g/dL (ref 30.0–36.0)
MCV: 97.7 fL (ref 78.0–100.0)
MONO ABS: 1 10*3/uL (ref 0.1–1.0)
MONOS PCT: 33 % — AB (ref 3–12)
NEUTROS ABS: 1.4 10*3/uL — AB (ref 1.7–7.7)
Neutrophils Relative %: 45 % (ref 43–77)
PLATELETS: 250 10*3/uL (ref 150–400)
RBC: 2.58 MIL/uL — AB (ref 4.22–5.81)
RDW: 15.2 % (ref 11.5–15.5)
WBC: 3.1 10*3/uL — AB (ref 4.0–10.5)

## 2014-07-02 LAB — COMPREHENSIVE METABOLIC PANEL
ALBUMIN: 2.6 g/dL — AB (ref 3.5–5.2)
ALT: 16 U/L (ref 0–53)
AST: 24 U/L (ref 0–37)
Alkaline Phosphatase: 75 U/L (ref 39–117)
Anion gap: 9 (ref 5–15)
BILIRUBIN TOTAL: 0.6 mg/dL (ref 0.3–1.2)
BUN: 33 mg/dL — ABNORMAL HIGH (ref 6–23)
CALCIUM: 8.6 mg/dL (ref 8.4–10.5)
CHLORIDE: 96 meq/L (ref 96–112)
CO2: 24 mmol/L (ref 19–32)
CREATININE: 1.75 mg/dL — AB (ref 0.50–1.35)
GFR calc non Af Amer: 33 mL/min — ABNORMAL LOW (ref >90)
GFR, EST AFRICAN AMERICAN: 38 mL/min — AB (ref >90)
Glucose, Bld: 101 mg/dL — ABNORMAL HIGH (ref 70–99)
POTASSIUM: 3.6 mmol/L (ref 3.5–5.1)
SODIUM: 129 mmol/L — AB (ref 135–145)
TOTAL PROTEIN: 5.6 g/dL — AB (ref 6.0–8.3)

## 2014-07-02 LAB — MRSA PCR SCREENING: MRSA by PCR: NEGATIVE

## 2014-07-02 LAB — TYPE AND SCREEN
ABO/RH(D): A POS
ANTIBODY SCREEN: NEGATIVE

## 2014-07-02 LAB — ABO/RH: ABO/RH(D): A POS

## 2014-07-02 LAB — PATHOLOGIST SMEAR REVIEW

## 2014-07-02 MED ORDER — METHYLPREDNISOLONE SODIUM SUCC 125 MG IJ SOLR
60.0000 mg | Freq: Once | INTRAMUSCULAR | Status: AC
  Administered 2014-07-02: 60 mg via INTRAVENOUS
  Filled 2014-07-02: qty 0.96

## 2014-07-02 MED ORDER — ACETAMINOPHEN 325 MG PO TABS: 650.0000 mg | ORAL_TABLET | Freq: Four times a day (QID) | ORAL | Status: AC | PRN

## 2014-07-02 NOTE — Evaluation (Signed)
Physical Therapy Evaluation Patient Details Name: Tony James MRN: [id number] DOB: 28-Oct-1922 Today's Date: 07/02/2014   History of Present Illness  81 yo male admitted with back pain. hx of prostate cancer, rectal cancer, bone mets, HTN  Clinical Impression  On eval, pt required Min guard-Min assist for mobility-able to ambulate ~500 feet with use of cane. Unsteady at times requiring Min assist to stabilize. Discussed recommendation for follow up HHPT but pt politely declined. Pt did agree to use RW for improved stability.     Follow Up Recommendations No PT follow up;Supervision/Assistance - 24 hour. Recommended HHPT follow up but pt declines HH. Feel pt could benefit if he would agree.     Equipment Recommendations  None recommended by PT (pt states he has access to walker at home and he agreed to use it)    Recommendations for Other Services       Precautions / Restrictions Precautions Precautions: Fall Restrictions Weight Bearing Restrictions: No      Mobility  Bed Mobility Overal bed mobility: Modified Independent                Transfers Overall transfer level: Needs assistance   Transfers: Sit to/from Stand Sit to Stand: Supervision         General transfer comment: for safety  Ambulation/Gait Ambulation/Gait assistance: Min guard;Min assist Ambulation Distance (Feet): 500 Feet Assistive device: Straight cane Gait Pattern/deviations: Step-through pattern     General Gait Details: Pt tolerated distance well. Unsteady at times (backing up, changes in direction) requiring Min assist. Otherwise, pt was Min guard assist. Dyspnea 2/4.   Stairs            Wheelchair Mobility    Modified Rankin (Stroke Patients Only)       Balance Overall balance assessment: Needs assistance;History of Falls Sitting-balance support: Bilateral upper extremity supported;Feet supported Sitting balance-Leahy Scale: Good     Standing balance support:  During functional activity Standing balance-Leahy Scale: Fair                               Pertinent Vitals/Pain Pain Assessment: No/denies pain    Home Living Family/patient expects to be discharged to:: Private residence Living Arrangements: Spouse/significant other   Type of Home: Apartment Environmental education officer) Home Access: Level entry     Home Layout: One level Home Equipment: Environmental consultant - 2 wheels;Cane - single point;Bedside commode;Grab bars - tub/shower;Grab bars - toilet;Shower seat      Prior Function Level of Independence: Independent with assistive device(s)         Comments: using cand for last 2-3 weeks     Hand Dominance        Extremity/Trunk Assessment   Upper Extremity Assessment: Overall WFL for tasks assessed           Lower Extremity Assessment: Generalized weakness      Cervical / Trunk Assessment: Kyphotic  Communication   Communication: No difficulties  Cognition Arousal/Alertness: Awake/alert Behavior During Therapy: WFL for tasks assessed/performed Overall Cognitive Status: Within Functional Limits for tasks assessed                      General Comments      Exercises        Assessment/Plan    PT Assessment Patient needs continued PT services  PT Diagnosis Difficulty walking;Generalized weakness   PT Problem List Decreased balance;Decreased mobility;Pain  PT Treatment Interventions DME  instruction;Gait training;Functional mobility training;Therapeutic activities;Patient/family education;Balance training   PT Goals (Current goals can be found in the Care Plan section) Acute Rehab PT Goals Patient Stated Goal: home soon PT Goal Formulation: With patient Time For Goal Achievement: 07/16/14 Potential to Achieve Goals: Good    Frequency Min 3X/week   Barriers to discharge        Co-evaluation               End of Session Equipment Utilized During Treatment: Gait belt Activity Tolerance: Patient  tolerated treatment well Patient left: in chair;with call bell/phone within reach;with family/visitor present           Time: 1335-1404 PT Time Calculation (min) (ACUTE ONLY): 29 min   Charges:   PT Evaluation $Initial PT Evaluation Tier I: 1 Procedure PT Treatments $Gait Training: 8-22 mins $Therapeutic Activity: 8-22 mins   PT G Codes:        Weston Anna, MPT Pager: [phone]
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
## 2014-07-02 NOTE — Progress Notes (Signed)
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 He reports severe pain in the low back and both legs. He presented to the emergency room yesterday and was admitted. The pain has resolved. He denies trauma prior to the onset of pain. No numbness or weakness. He fell after returning from the bathroom last night after a syncope event. He reports not eating or drinking all day yesterday.  Objective: Vital signs in last 24 hours: Blood pressure 129/57, pulse 86, temperature 99.5 F (37.5 C), temperature source Oral, resp. rate 18, height 5\' 11"  (1.803 m), weight 207 lb 14.3 oz (94.3 kg), SpO2 95 %.  Intake/Output from previous day:    Physical Exam:  HEENT: No thrush Lungs: Clear bilaterally Cardiac: Regular rate and rhythm Abdomen: Soft and nontender Extremities: No leg edema Neurologic: Good leg strength bilaterally Musculoskeletal: No spine tenderness, no pain with motion at the hip bilaterally  Portacath/PICC-without erythema  Lab Results:  Recent Labs  07/01/14 1004 07/02/14 0527  WBC 2.1* 3.1*  HGB 9.9* 8.5*  HCT 29.3* 25.2*  PLT 278 250   ANC 1.4  BMET  Recent Labs  07/01/14 1004 07/02/14 0527  NA 131* 129*  K 3.2* 3.6  CL 93* 96  CO2 25 24  GLUCOSE 140* 101*  BUN 29* 33*  CREATININE 1.81* 1.75*  CALCIUM 9.2 8.6    Studies/Results: Ct Lumbar Spine Wo Contrast  07/01/2014   CLINICAL DATA:  Low back pain with bilateral radicular symptoms. Bony metastases from previous prostate carcinoma. Patient also with colon carcinoma  EXAM: CT LUMBAR SPINE WITHOUT CONTRAST  TECHNIQUE: Multidetector CT imaging of the lumbar spine was performed without intravenous contrast administration. Multiplanar CT image reconstructions were also generated.  COMPARISON:  CT abdomen and pelvis with bony reformats May 01, 2014  FINDINGS: There are sclerotic metastases along the superior aspect of the L1 vertebral body  in the anterior, inferior aspect of the L2 vertebral body. There is a smaller metastatic focus, sclerotic, in the superior posterior aspect of the L2 vertebral body. A small bony metastasis is noted in the left pedicle of the L2 vertebral body. There is a small sclerotic metastasis in the posterior mid L4 vertebral body. There is a sclerotic metastasis in the inferior aspect of the L5 vertebral body toward the anterior aspect. There is a tiny metastatic focus in the posterior inferior L2 vertebral body. There are sclerotic foci in the anterior leftward aspects of the L4 and L5 vertebral bodies. There is a sclerotic metastatic focus at the junction of the left L4 pedicle and pars interarticularis.  There is no demonstrable fracture. There is minimal anterolisthesis of L2 on L3, felt to be due to underlying spondylosis. No other spondylolisthesis is appreciable. There is disc narrowing at L5-S1 with vacuum phenomenon at this level.  There is underlying osteoporosis.  At T12-L1, there is moderate facet hypertrophy bilaterally. No nerve root edema or effacement. No disc extrusion or stenosis.  At L1-2, there is broad-based disc bulging and moderate facet hypertrophy bilaterally. There is no nerve root edema or effacement. No disc extrusion or stenosis.  At L2-3, there is moderately severe facet osteoarthritic change bilaterally. There is moderate diffuse disc bulging. No nerve root edema or effacement. No disc extrusion. There is, however, moderate spinal stenosis due to bony hypertrophy and generalized disc bulging/protrusion.  At L3-4, there is severe facet and moderate ligamentum flava hypertrophy bilaterally. There is diffuse  disc protrusion. These findings in concert lead to moderate generalized spinal stenosis. There is no frank disc extrusion at this level.  At L4-5, there is moderate facet hypertrophy bilaterally and generalized disc protrusion. These findings lead to mild generalized stenosis at L4-5, less than  at L2-3 and L3-4. No disc extrusion at this level.  At L5-S1, there is moderate facet hypertrophy bilaterally. There is moderate disc bulging. There are osteophytes causing impression on the exiting nerve roots bilaterally without appreciable nerve root edema or effacement. No disc extrusion or stenosis at this level.  There is moderate atherosclerotic change in the abdominal aorta.  IMPRESSION: Multiple sclerotic metastases, not significantly changed from 2 months prior. Underlying osteoporosis.  Multifocal osteoarthritic change. Slight spondylolisthesis at L2-3 is felt to be due to underlying spondylosis. No acute fracture.  Moderate generalized spinal stenosis at L2-3 and L3-4 with milder stenosis at L4-5, multifactorial as discussed above. No frank disc extrusion.   Electronically Signed   By: Lowella Grip M.D.   On: 07/01/2014 15:49    Medications: I have reviewed the patient's current medications.  Assessment/Plan:  1.Rectal cancer, 1-2 centimeters above the dentate line,uT3N0, invasive adenocarcinoma confirmed on a colonoscopic biopsy 09/03/2012. He began concurrent Xeloda and radiation on 01/13/2013, completed 01/31/2013.   Progress of rectal symptoms, initiation of single agent capecitabine 03/02/2014, 7 days on/7 days off, discontinued after an office visit 04/22/2014  Staging CTs 05/01/2014 with no evidence of distant metastatic rectal cancer  Cycle 1 FOLFOX 06/16/2014 2. Prostate cancer, status post a prostatectomy in 1993, pelvic radiation in 1998 for a rising PSA, subsequent hormonal therapy and now maintained off of specific therapy with a rising PSA. He is followed by Dr. Rosana Hoes.  3. Left lower lobe 13 mm groundglass nodule on a CT of the chest 09/05/2012. Stable lung nodules on the chest CT 05/01/2014. 4. Sclerotic focus in the T6 vertebra on the chest CT 09/05/2012. Progressive sclerotic bone lesions on the CTs 05/01/2014 5. Rectal urgency and bleeding secondary to #1. 6.  Anemia secondary to rectal bleeding, prostate cancer, renal insufficiency, and chemotherapy/radiation 7. Renal insufficiency 8. Pain secondary to rectal cancer 9. Urinary hesitancy-likely secondary to rectal cancer 10. Neutropenia secondary to chemotherapy-improved 11. Acute onset low back and bilateral leg pain 06/30/2014, etiology unclear-resolved 12. Fall, syncope event? 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 He was admitted for pain control and further evaluation. He has diffuse degenerative changes on a CT of the lumbar spine without and expiration for acute pain. I doubt the pain is related to the known metastatic prostate cancer or rectal cancer. The pain is most likely related to degenerative spine disease. Neuropathic pain related to oxaliplatin is possible, but I doubt this would resolve so quickly.  He does not appear to have a systemic infection. The neutrophil count is higher today.  Recommendations: 1. Increase ambulation as tolerated 2. Pain control, further evaluation of the back pain per Dr. Virgina Jock 3. Return to [facility] as scheduled for cycle 2 FOLFOX 07/08/2014 4. Please call oncology as needed    LOS: 1 day   [facility], Everrett Lacasse  07/02/2014, 8:41 AM

## 2014-07-02 NOTE — Progress Notes (Signed)
Subjective: Admitted 07/01/14 c Intractable LBP and AFTT He failed to gain control c IV Dilaudid Await MRI He is back to normal this am and is at baseline. He fell last night.   Objective: Vital signs in last 24 hours: Temp:  [97.8 F (36.6 C)-99.5 F (37.5 C)] 99.5 F (37.5 C) (01/21 0252) Pulse Rate:  [78-102] 86 (01/21 0252) Resp:  [18-20] 18 (01/21 0252) BP: (118-158)/(51-70) 129/57 mmHg (01/21 0252) SpO2:  [93 %-100 %] 95 % (01/21 0252) Weight:  [94.3 kg (207 lb 14.3 oz)] 94.3 kg (207 lb 14.3 oz) (01/20 1856) Weight change:  Last BM Date: 06/30/14  CBG (last 3)  No results for input(s): GLUCAP in the last 72 hours.  Intake/Output from previous day: No intake or output data in the 24 hours ending 07/02/14 0709     Physical Exam  General appearance: A and O x 3 Throat: oropharynx moist without erythema Resp: CTA B PAC - no issues.  L Arm PIV Cardio: Reg GI: soft, non-tender; bowel sounds normal; no masses,  no organomegaly Extremities: no clubbing, cyanosis or edema  (-) SLR.  Able to move B legs and hips in bed s issues   Lab Results:  Recent Labs  07/01/14 1004 07/01/14 2023 07/02/14 0527  NA 131*  --  129*  K 3.2*  --  3.6  CL 93*  --  96  CO2 25  --  24  GLUCOSE 140*  --  101*  BUN 29*  --  33*  CREATININE 1.81*  --  1.75*  CALCIUM 9.2  --  8.6  MG  --  1.1*  --   PHOS  --  3.2  --      Recent Labs  06/30/14 0838 07/02/14 0527  AST 23 24  ALT 14 16  ALKPHOS 90 75  BILITOT 0.32 0.6  PROT 6.6 5.6*  ALBUMIN 2.8* 2.6*     Recent Labs  07/01/14 1004 07/02/14 0527  WBC 2.1* 3.1*  NEUTROABS 0.5* 1.4*  HGB 9.9* 8.5*  HCT 29.3* 25.2*  MCV 98.0 97.7  PLT 278 250    Lab Results  Component Value Date   INR 1.09 06/10/2014    No results for input(s): CKTOTAL, CKMB, CKMBINDEX, TROPONINI in the last 72 hours.  No results for input(s): TSH, T4TOTAL, T3FREE, THYROIDAB in the last 72 hours.  Invalid input(s): FREET3  No results  for input(s): VITAMINB12, FOLATE, FERRITIN, TIBC, IRON, RETICCTPCT in the last 72 hours.  Micro Results: Recent Results (from the past 240 hour(s))  MRSA PCR Screening     Status: None   Collection Time: 07/02/14  1:20 AM  Result Value Ref Range Status   MRSA by PCR NEGATIVE NEGATIVE Final    Comment:        The GeneXpert MRSA Assay (FDA approved for NASAL specimens only), is one component of a comprehensive MRSA colonization surveillance program. It is not intended to diagnose MRSA infection nor to guide or monitor treatment for MRSA infections.      Studies/Results: Ct Lumbar Spine Wo Contrast  07/01/2014   CLINICAL DATA:  Low back pain with bilateral radicular symptoms. Bony metastases from previous prostate carcinoma. Patient also with colon carcinoma  EXAM: CT LUMBAR SPINE WITHOUT CONTRAST  TECHNIQUE: Multidetector CT imaging of the lumbar spine was performed without intravenous contrast administration. Multiplanar CT image reconstructions were also generated.  COMPARISON:  CT abdomen and pelvis with bony reformats May 01, 2014  FINDINGS: There are sclerotic  metastases along the superior aspect of the L1 vertebral body in the anterior, inferior aspect of the L2 vertebral body. There is a smaller metastatic focus, sclerotic, in the superior posterior aspect of the L2 vertebral body. A small bony metastasis is noted in the left pedicle of the L2 vertebral body. There is a small sclerotic metastasis in the posterior mid L4 vertebral body. There is a sclerotic metastasis in the inferior aspect of the L5 vertebral body toward the anterior aspect. There is a tiny metastatic focus in the posterior inferior L2 vertebral body. There are sclerotic foci in the anterior leftward aspects of the L4 and L5 vertebral bodies. There is a sclerotic metastatic focus at the junction of the left L4 pedicle and pars interarticularis.  There is no demonstrable fracture. There is minimal anterolisthesis of  L2 on L3, felt to be due to underlying spondylosis. No other spondylolisthesis is appreciable. There is disc narrowing at L5-S1 with vacuum phenomenon at this level.  There is underlying osteoporosis.  At T12-L1, there is moderate facet hypertrophy bilaterally. No nerve root edema or effacement. No disc extrusion or stenosis.  At L1-2, there is broad-based disc bulging and moderate facet hypertrophy bilaterally. There is no nerve root edema or effacement. No disc extrusion or stenosis.  At L2-3, there is moderately severe facet osteoarthritic change bilaterally. There is moderate diffuse disc bulging. No nerve root edema or effacement. No disc extrusion. There is, however, moderate spinal stenosis due to bony hypertrophy and generalized disc bulging/protrusion.  At L3-4, there is severe facet and moderate ligamentum flava hypertrophy bilaterally. There is diffuse disc protrusion. These findings in concert lead to moderate generalized spinal stenosis. There is no frank disc extrusion at this level.  At L4-5, there is moderate facet hypertrophy bilaterally and generalized disc protrusion. These findings lead to mild generalized stenosis at L4-5, less than at L2-3 and L3-4. No disc extrusion at this level.  At L5-S1, there is moderate facet hypertrophy bilaterally. There is moderate disc bulging. There are osteophytes causing impression on the exiting nerve roots bilaterally without appreciable nerve root edema or effacement. No disc extrusion or stenosis at this level.  There is moderate atherosclerotic change in the abdominal aorta.  IMPRESSION: Multiple sclerotic metastases, not significantly changed from 2 months prior. Underlying osteoporosis.  Multifocal osteoarthritic change. Slight spondylolisthesis at L2-3 is felt to be due to underlying spondylosis. No acute fracture.  Moderate generalized spinal stenosis at L2-3 and L3-4 with milder stenosis at L4-5, multifactorial as discussed above. No frank disc  extrusion.   Electronically Signed   By: Lowella Grip M.D.   On: 07/01/2014 15:49     Medications: Scheduled: . aspirin EC  81 mg Oral Daily  . atenolol  50 mg Oral QHS  . enoxaparin (LOVENOX) injection  40 mg Subcutaneous Q24H  . felodipine  5 mg Oral q morning - 10a  . senna-docusate  1 tablet Oral BID  . sodium chloride  3 mL Intravenous Q12H   Continuous:    Assessment/Plan: Active Problems:   Back pain  Intractable LBP - Known Spinal Mets but no changes on CT.  Await MRI.  No obvious Compression Fracture noted. Acute pain crisis is possibly  from the degenerative changes seen at L2-3 or L3-4 on the CT.  Add Steroids - Solumedrol 60 IV x one.  May need Fentanyl Patch or better long acting pain meds?  ? Muscle relaxors?  He is better this am.  Will get him up c  PT and see what he is able to do  Rectal CA - FOLFOX per Dr Benay Spice.  Has Central IV s Fever or White count  Prostate CA.  Anemia/Neutropenia - Hold Neupogen/Neulasta in face of pain.  Type and screen ordered.  DNR  HTN - Controlled  Hyponatremia - Monitor  Hypokalemia - Repleted  Constipation - Miralax prn  CKD - cr stable 1.7-1.8  DVT Prophylaxis    LOS: 1 day   Jayen Bromwell M 07/02/2014, 7:09 AM

## 2014-07-02 NOTE — Progress Notes (Signed)
CARE MANAGEMENT NOTE 07/02/2014  Patient:  Tony James, Tony James   Account Number:  [id number]  Date Initiated:  07/02/2014  Documentation initiated by:  Edwyna Shell  Subjective/Objective Assessment:   81 yo male admitted with Admitted 07/01/14 c Intractable LBP and AFTT  He failed to gain control c IV Dilaudid     Action/Plan:   discharge planning   Anticipated DC Date:  07/03/2014   Anticipated DC Plan:  [place]  CM consult      Choice offered to / List presented to:             Status of service:  Completed, signed off Medicare Important Message given?   (If response is "NO", the following Medicare IM given date fields will be blank) Date Medicare IM given:   Medicare IM given by:   Date Additional Medicare IM given:   Additional Medicare IM given by:    Discharge Disposition:    Per UR Regulation:    If discussed at Long Length of Stay Meetings, dates discussed:    Comments:  07/02/14 Edwyna Shell RN [facility] CM [phone] Patient resides at [employer] in a cottage with his spouse. He has a walker, cane, BSC, shower chair, and wheelchair available if needed. He stated that he has been recently using his cane. He stated that he has been to a rehab facility in the past and has also had [facility] services but does not feel that he needs any services upon discharge. He stated that he is well connected in his [place] community and has a lot of supporting family. Will continue to follow for [facility] needs if patient agreeable.

## 2014-07-02 NOTE — Progress Notes (Signed)
Clinical Social Work  CSW received inappropriate referral that patient was from [facility]. CM spoke with patient and grandson at bedside who reports he lives in independent living and plans to return there with his wife. Patient reports he is not interested in SNF placement and prefers to return to his apartment.  CSW is signing off but available if needed.  [place], [place] [phone]
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
## 2014-07-02 NOTE — Progress Notes (Signed)
   07/02/14 0240  What Happened  Was fall witnessed? Yes  Who witnessed fall? Lennice Sites  Patients activity before fall bathroom-assisted  Point of contact buttocks  Was patient injured? No  Follow Up  MD notified Perini  Time MD notified 6  Family notified No- patient refusal  Additional tests No  Progress note created (see row info) Yes  Adult Fall Risk Assessment  Risk Factor Category (scoring not indicated) Fall has occurred during this admission (document High fall risk)  Patient's Fall Risk High Fall Risk (>13 points)  Adult Fall Risk Interventions  Required Bundle Interventions *See Row Information* High fall risk - low, moderate, and high requirements implemented  Additional Interventions Use of appropriate toileting equipment (bedpan, BSC, etc.);Secure all tubes/drains  Pain Assessment  Pain Assessment No/denies pain  PCA/Epidural/Spinal Assessment  Respiratory Pattern Regular  Neurological  Neuro (WDL) WDL  Level of Consciousness Alert  Orientation Level Oriented X4  Musculoskeletal  Musculoskeletal (WDL) X  Assistive Device Cane  Generalized Weakness Yes  Musculoskeletal Details  RLE Limited movement (related to pain, patient states no new pain now)  LLE Limited movement  Integumentary  Integumentary (WDL) X  Skin Color (new abrasion to left elbow.)  Skin Condition Dry  Skin Integrity Abrasion  Abrasion Location Elbow  Abrasion Location Orientation Left

## 2014-07-08 ENCOUNTER — Other Ambulatory Visit (HOSPITAL_BASED_OUTPATIENT_CLINIC_OR_DEPARTMENT_OTHER): Payer: Medicare Other

## 2014-07-08 ENCOUNTER — Other Ambulatory Visit: Payer: Self-pay | Admitting: *Deleted

## 2014-07-08 ENCOUNTER — Other Ambulatory Visit: Payer: Self-pay | Admitting: Oncology

## 2014-07-08 ENCOUNTER — Ambulatory Visit (HOSPITAL_BASED_OUTPATIENT_CLINIC_OR_DEPARTMENT_OTHER): Payer: Medicare Other

## 2014-07-08 DIAGNOSIS — Z5112 Encounter for antineoplastic immunotherapy: Secondary | ICD-10-CM

## 2014-07-08 DIAGNOSIS — C20 Malignant neoplasm of rectum: Secondary | ICD-10-CM

## 2014-07-08 DIAGNOSIS — Z5111 Encounter for antineoplastic chemotherapy: Secondary | ICD-10-CM

## 2014-07-08 LAB — CBC WITH DIFFERENTIAL/PLATELET
BASO%: 0.7 % (ref 0.0–2.0)
BASOS ABS: 0.1 10*3/uL (ref 0.0–0.1)
EOS%: 0.2 % (ref 0.0–7.0)
Eosinophils Absolute: 0 10*3/uL (ref 0.0–0.5)
HEMATOCRIT: 32.9 % — AB (ref 38.4–49.9)
HEMOGLOBIN: 10.5 g/dL — AB (ref 13.0–17.1)
LYMPH#: 1.9 10*3/uL (ref 0.9–3.3)
LYMPH%: 18.8 % (ref 14.0–49.0)
MCH: 32.3 pg (ref 27.2–33.4)
MCHC: 31.9 g/dL — AB (ref 32.0–36.0)
MCV: 101.3 fL — AB (ref 79.3–98.0)
MONO#: 1.3 10*3/uL — ABNORMAL HIGH (ref 0.1–0.9)
MONO%: 12.9 % (ref 0.0–14.0)
NEUT%: 67.4 % (ref 39.0–75.0)
NEUTROS ABS: 6.9 10*3/uL — AB (ref 1.5–6.5)
PLATELETS: 348 10*3/uL (ref 140–400)
RBC: 3.25 10*6/uL — ABNORMAL LOW (ref 4.20–5.82)
RDW: 15.9 % — ABNORMAL HIGH (ref 11.0–14.6)
WBC: 10.3 10*3/uL (ref 4.0–10.3)

## 2014-07-08 LAB — COMPREHENSIVE METABOLIC PANEL (CC13)
ALBUMIN: 2.8 g/dL — AB (ref 3.5–5.0)
ALK PHOS: 99 U/L (ref 40–150)
ALT: 20 U/L (ref 0–55)
AST: 29 U/L (ref 5–34)
Anion Gap: 14 mEq/L — ABNORMAL HIGH (ref 3–11)
BILIRUBIN TOTAL: 0.32 mg/dL (ref 0.20–1.20)
BUN: 22.7 mg/dL (ref 7.0–26.0)
CO2: 26 mEq/L (ref 22–29)
CREATININE: 1.5 mg/dL — AB (ref 0.7–1.3)
Calcium: 9.6 mg/dL (ref 8.4–10.4)
Chloride: 100 mEq/L (ref 98–109)
EGFR: 41 mL/min/{1.73_m2} — AB (ref >90)
Glucose: 108 mg/dl (ref 70–140)
Potassium: 3.8 mEq/L (ref 3.5–5.1)
Sodium: 140 mEq/L (ref 136–145)
Total Protein: 6.6 g/dL (ref 6.4–8.3)

## 2014-07-08 MED ORDER — OXALIPLATIN CHEMO INJECTION 100 MG/20ML
85.0000 mg/m2 | Freq: Once | INTRAVENOUS | Status: AC
  Administered 2014-07-08: 185 mg via INTRAVENOUS
  Filled 2014-07-08: qty 37

## 2014-07-08 MED ORDER — DEXAMETHASONE SODIUM PHOSPHATE 10 MG/ML IJ SOLN
INTRAMUSCULAR | Status: AC
  Filled 2014-07-08: qty 1

## 2014-07-08 MED ORDER — SODIUM CHLORIDE 0.9 % IV SOLN
5.0000 mg/kg | Freq: Once | INTRAVENOUS | Status: AC
  Administered 2014-07-08: 475 mg via INTRAVENOUS
  Filled 2014-07-08: qty 19

## 2014-07-08 MED ORDER — DEXAMETHASONE SODIUM PHOSPHATE 10 MG/ML IJ SOLN
10.0000 mg | Freq: Once | INTRAMUSCULAR | Status: AC
  Administered 2014-07-08: 10 mg via INTRAVENOUS

## 2014-07-08 MED ORDER — ONDANSETRON 8 MG/50ML IVPB (CHCC)
8.0000 mg | Freq: Once | INTRAVENOUS | Status: AC
  Administered 2014-07-08: 8 mg via INTRAVENOUS

## 2014-07-08 MED ORDER — ONDANSETRON 8 MG/NS 50 ML IVPB
INTRAVENOUS | Status: AC
  Filled 2014-07-08: qty 8

## 2014-07-08 MED ORDER — HEPARIN SOD (PORK) LOCK FLUSH 100 UNIT/ML IV SOLN
500.0000 [IU] | Freq: Once | INTRAVENOUS | Status: DC | PRN
  Filled 2014-07-08: qty 5

## 2014-07-08 MED ORDER — LEUCOVORIN CALCIUM INJECTION 350 MG
400.0000 mg/m2 | Freq: Once | INTRAVENOUS | Status: AC
  Administered 2014-07-08: 880 mg via INTRAVENOUS
  Filled 2014-07-08: qty 44

## 2014-07-08 MED ORDER — SODIUM CHLORIDE 0.9 % IV SOLN
2400.0000 mg/m2 | INTRAVENOUS | Status: DC
  Administered 2014-07-08: 5300 mg via INTRAVENOUS
  Filled 2014-07-08: qty 106

## 2014-07-08 MED ORDER — DEXTROSE 5 % IV SOLN
Freq: Once | INTRAVENOUS | Status: AC
  Administered 2014-07-08: 15:00:00 via INTRAVENOUS

## 2014-07-08 MED ORDER — FLUOROURACIL CHEMO INJECTION 2.5 GM/50ML
400.0000 mg/m2 | Freq: Once | INTRAVENOUS | Status: AC
Start: 2014-07-08 — End: 2014-07-08
  Administered 2014-07-08: 900 mg via INTRAVENOUS
  Filled 2014-07-08: qty 18

## 2014-07-08 MED ORDER — SODIUM CHLORIDE 0.9 % IJ SOLN
10.0000 mL | INTRAMUSCULAR | Status: DC | PRN
  Filled 2014-07-08: qty 10

## 2014-07-08 NOTE — Progress Notes (Signed)
Received  CBC/ diff by fax from PCP. OK to treat with these counts, per Dr. Benay Spice. CBC canceled for today. Pt will have CMET only.

## 2014-07-08 NOTE — Patient Instructions (Signed)
[facility] Discharge Instructions for Patients Receiving Chemotherapy  Today you received the following chemotherapy agents Oxaliplatin, Leucovorin and 5FU.  To help prevent nausea and vomiting after your treatment, we encourage you to take your nausea medication as prescribed.   If you develop nausea and vomiting that is not controlled by your nausea medication, call the clinic.   BELOW ARE SYMPTOMS THAT SHOULD BE REPORTED IMMEDIATELY:  *FEVER GREATER THAN 100.5 F  *CHILLS WITH OR WITHOUT FEVER  NAUSEA AND VOMITING THAT IS NOT CONTROLLED WITH YOUR NAUSEA MEDICATION  *UNUSUAL SHORTNESS OF BREATH  *UNUSUAL BRUISING OR BLEEDING  TENDERNESS IN MOUTH AND THROAT WITH OR WITHOUT PRESENCE OF ULCERS  *URINARY PROBLEMS  *BOWEL PROBLEMS  UNUSUAL RASH Items with * indicate a potential emergency and should be followed up as soon as possible.  Feel free to call the clinic you have any questions or concerns. The clinic phone number is [phone].

## 2014-07-10 ENCOUNTER — Ambulatory Visit (HOSPITAL_BASED_OUTPATIENT_CLINIC_OR_DEPARTMENT_OTHER): Payer: Medicare Other

## 2014-07-10 ENCOUNTER — Ambulatory Visit: Payer: Medicare Other

## 2014-07-10 DIAGNOSIS — Z5189 Encounter for other specified aftercare: Secondary | ICD-10-CM

## 2014-07-10 DIAGNOSIS — C20 Malignant neoplasm of rectum: Secondary | ICD-10-CM

## 2014-07-10 MED ORDER — SODIUM CHLORIDE 0.9 % IJ SOLN
10.0000 mL | INTRAMUSCULAR | Status: DC | PRN
  Administered 2014-07-10: 10 mL
  Filled 2014-07-10: qty 10

## 2014-07-10 MED ORDER — HEPARIN SOD (PORK) LOCK FLUSH 100 UNIT/ML IV SOLN
500.0000 [IU] | Freq: Once | INTRAVENOUS | Status: AC | PRN
  Administered 2014-07-10: 500 [IU]
  Filled 2014-07-10: qty 5

## 2014-07-10 MED ORDER — PEGFILGRASTIM INJECTION 6 MG/0.6ML ~~LOC~~
6.0000 mg | PREFILLED_SYRINGE | Freq: Once | SUBCUTANEOUS | Status: AC
  Administered 2014-07-10: 6 mg via SUBCUTANEOUS
  Filled 2014-07-10: qty 0.6

## 2014-07-10 NOTE — Patient Instructions (Signed)
Pegfilgrastim injection What is this medicine? PEGFILGRASTIM (peg fil GRA stim) is a long-acting granulocyte colony-stimulating factor that stimulates the growth of neutrophils, a type of white blood cell important in the body's fight against infection. It is used to reduce the incidence of fever and infection in patients with certain types of cancer who are receiving chemotherapy that affects the bone marrow. This medicine may be used for other purposes; ask your health care provider or pharmacist if you have questions. COMMON BRAND NAME(S): Neulasta What should I tell my health care provider before I take this medicine? They need to know if you have any of these conditions: -latex allergy -ongoing radiation therapy -sickle cell disease -skin reactions to acrylic adhesives (On-Body Injector only) -an unusual or allergic reaction to pegfilgrastim, filgrastim, other medicines, foods, dyes, or preservatives -pregnant or trying to get pregnant -breast-feeding How should I use this medicine? This medicine is for injection under the skin. If you get this medicine at home, you will be taught how to prepare and give the pre-filled syringe or how to use the On-body Injector. Refer to the patient Instructions for Use for detailed instructions. Use exactly as directed. Take your medicine at regular intervals. Do not take your medicine more often than directed. It is important that you put your used needles and syringes in a special sharps container. Do not put them in a trash can. If you do not have a sharps container, call your pharmacist or healthcare provider to get one. Talk to your pediatrician regarding the use of this medicine in children. Special care may be needed. Overdosage: If you think you have taken too much of this medicine contact a poison control center or emergency room at once. NOTE: This medicine is only for you. Do not share this medicine with others. What if I miss a dose? It is  important not to miss your dose. Call your doctor or health care professional if you miss your dose. If you miss a dose due to an On-body Injector failure or leakage, a new dose should be administered as soon as possible using a single prefilled syringe for manual use. What may interact with this medicine? Interactions have not been studied. Give your health care provider a list of all the medicines, herbs, non-prescription drugs, or dietary supplements you use. Also tell them if you smoke, drink alcohol, or use illegal drugs. Some items may interact with your medicine. This list may not describe all possible interactions. Give your health care provider a list of all the medicines, herbs, non-prescription drugs, or dietary supplements you use. Also tell them if you smoke, drink alcohol, or use illegal drugs. Some items may interact with your medicine. What should I watch for while using this medicine? You may need blood work done while you are taking this medicine. If you are going to need a MRI, CT scan, or other procedure, tell your doctor that you are using this medicine (On-Body Injector only). What side effects may I notice from receiving this medicine? Side effects that you should report to your doctor or health care professional as soon as possible: -allergic reactions like skin rash, itching or hives, swelling of the face, lips, or tongue -dizziness -fever -pain, redness, or irritation at site where injected -pinpoint red spots on the skin -shortness of breath or breathing problems -stomach or side pain, or pain at the shoulder -swelling -tiredness -trouble passing urine Side effects that usually do not require medical attention (report to your doctor   or health care professional if they continue or are bothersome): -bone pain -muscle pain This list may not describe all possible side effects. Call your doctor for medical advice about side effects. You may report side effects to FDA at  1-800-FDA-1088. Where should I keep my medicine? Keep out of the reach of children. Store pre-filled syringes in a refrigerator between 2 and 8 degrees C (36 and 46 degrees F). Do not freeze. Keep in carton to protect from light. Throw away this medicine if it is left out of the refrigerator for more than 48 hours. Throw away any unused medicine after the expiration date. NOTE: This sheet is a summary. It may not cover all possible information. If you have questions about this medicine, talk to your doctor, pharmacist, or health care provider.  2015, Elsevier/Gold Standard. (2013-08-28 16:14:05) Fluorouracil, 5FU; Diclofenac topical cream What is this medicine? FLUOROURACIL; DICLOFENAC (flure oh YOOR a sil; dye KLOE fen ak) is a combination of a topical chemotherapy agent and non-steroidal anti-inflammatory drug (NSAID). It is used on the skin to treat skin cancer and skin conditions that could become cancer. This medicine may be used for other purposes; ask your health care provider or pharmacist if you have questions. COMMON BRAND NAME(S): FLUORAC What should I tell my health care provider before I take this medicine? They need to know if you have any of these conditions: -bleeding problems -cigarette smoker -DPD enzyme deficiency -heart disease -high blood pressure -if you frequently drink alcohol containing drinks -kidney disease -liver disease -open or infected skin -stomach problems -swelling or open sores at the treatment site -recent or planned coronary artery bypass graft (CABG) surgery -an unusual or allergic reaction to fluorouracil, diclofenac, aspirin, other NSAIDs, other medicines, foods, dyes, or preservatives -pregnant or trying to get pregnant -breast-feeding How should I use this medicine? This medicine is only for use on the skin. Follow the directions on the prescription label. Wash hands before and after use. Wash affected area and gently pat dry. To apply this  medicine use a cotton-tipped applicator, or use gloves if applying with fingertips. If applied with unprotected fingertips, it is very important to wash your hands well after you apply this medicine. Avoid applying to the eyes, nose, or mouth. Apply enough medicine to cover the affected area. You can cover the area with a light gauze dressing, but do not use tight or air-tight dressings. Finish the full course prescribed by your doctor or health care professional, even if you think your condition is better. Do not stop taking except on the advice of your doctor or health care professional. Talk to your pediatrician regarding the use of this medicine in children. Special care may be needed. Overdosage: If you think you've taken too much of this medicine contact a poison control center or emergency room at once. Overdosage: If you think you have taken too much of this medicine contact a poison control center or emergency room at once. NOTE: This medicine is only for you. Do not share this medicine with others. What if I miss a dose? If you miss a dose, apply it as soon as you can. If it is almost time for your next dose, only use that dose. Do not apply extra doses. Contact your doctor or health care professional if you miss more than one dose. What may interact with this medicine? Interactions are not expected. Do not use any other skin products without telling your doctor or health care professional. This list   may not describe all possible interactions. Give your health care provider a list of all the medicines, herbs, non-prescription drugs, or dietary supplements you use. Also tell them if you smoke, drink alcohol, or use illegal drugs. Some items may interact with your medicine. What should I watch for while using this medicine? Visit your doctor or health care professional for checks on your progress. You will need to use this medicine for 2 to 6 weeks. This may be longer depending on the condition  being treated. You may not see full healing for another 1 to 2 months after you stop using the medicine. Treated areas of skin can look unsightly during and for several weeks after treatment with this medicine. This medicine can make you more sensitive to the sun. Keep out of the sun. If you cannot avoid being in the sun, wear protective clothing and use sunscreen. Do not use sun lamps or tanning beds/booths. Where should I keep my What side effects may I notice from receiving this medicine? Side effects that you should report to your doctor or health care professional as soon as possible: -allergic reactions like skin rash, itching or hives, swelling of the face, lips, or tongue -black or bloody stools, blood in the urine or vomit -blurred vision -chest pain -difficulty breathing or wheezing -redness, blistering, peeling or loosening of the skin, including inside the mouth -severe redness and swelling of normal skin -slurred speech or weakness on one side of the body -trouble passing urine or change in the amount of urine -unexplained weight gain or swelling -unusually weak or tired -yellowing of eyes or skin Side effects that usually do not require medical attention (Report these to your doctor or health care professional if they continue or are bothersome.): -increased sensitivity of the skin to sun and ultraviolet light -pain and burning of the affected area -scaling or swelling of the affected area -skin rash, itching of the affected area -tenderness This list may not describe all possible side effects. Call your doctor for medical advice about side effects. You may report side effects to FDA at 1-800-FDA-1088. Where should I keep my medicine? Keep out of the reach of children. Store at room temperature between 20 and 25 degrees C (68 and 77 degrees F). Throw away any unused medicine after the expiration date. NOTE: This sheet is a summary. It may not cover all possible information.  If you have questions about this medicine, talk to your doctor, pharmacist, or health care provider.  2015, Elsevier/Gold Standard. (2013-09-29 11:09:58)  

## 2014-07-15 ENCOUNTER — Ambulatory Visit (HOSPITAL_COMMUNITY): Payer: Medicare Other | Attending: Emergency Medicine

## 2014-07-19 ENCOUNTER — Other Ambulatory Visit: Payer: Self-pay | Admitting: Oncology

## 2014-07-22 ENCOUNTER — Ambulatory Visit (HOSPITAL_BASED_OUTPATIENT_CLINIC_OR_DEPARTMENT_OTHER): Payer: Medicare Other | Admitting: Oncology

## 2014-07-22 ENCOUNTER — Other Ambulatory Visit (HOSPITAL_BASED_OUTPATIENT_CLINIC_OR_DEPARTMENT_OTHER): Payer: Medicare Other

## 2014-07-22 ENCOUNTER — Ambulatory Visit: Payer: Medicare Other

## 2014-07-22 ENCOUNTER — Telehealth: Payer: Self-pay | Admitting: Oncology

## 2014-07-22 VITALS — BP 138/42 | HR 80 | Temp 98.0°F | Resp 19 | Ht 71.0 in | Wt 206.9 lb

## 2014-07-22 DIAGNOSIS — C20 Malignant neoplasm of rectum: Secondary | ICD-10-CM

## 2014-07-22 DIAGNOSIS — G893 Neoplasm related pain (acute) (chronic): Secondary | ICD-10-CM

## 2014-07-22 DIAGNOSIS — N289 Disorder of kidney and ureter, unspecified: Secondary | ICD-10-CM

## 2014-07-22 DIAGNOSIS — D630 Anemia in neoplastic disease: Secondary | ICD-10-CM

## 2014-07-22 LAB — CBC WITH DIFFERENTIAL/PLATELET
BASO%: 0.4 % (ref 0.0–2.0)
Basophils Absolute: 0 10*3/uL (ref 0.0–0.1)
EOS ABS: 0 10*3/uL (ref 0.0–0.5)
EOS%: 0.1 % (ref 0.0–7.0)
HCT: 30 % — ABNORMAL LOW (ref 38.4–49.9)
HGB: 9.5 g/dL — ABNORMAL LOW (ref 13.0–17.1)
LYMPH#: 1.3 10*3/uL (ref 0.9–3.3)
LYMPH%: 14.7 % (ref 14.0–49.0)
MCH: 32 pg (ref 27.2–33.4)
MCHC: 31.8 g/dL — ABNORMAL LOW (ref 32.0–36.0)
MCV: 100.5 fL — ABNORMAL HIGH (ref 79.3–98.0)
MONO#: 0.9 10*3/uL (ref 0.1–0.9)
MONO%: 10.4 % (ref 0.0–14.0)
NEUT%: 74.4 % (ref 39.0–75.0)
NEUTROS ABS: 6.5 10*3/uL (ref 1.5–6.5)
PLATELETS: 311 10*3/uL (ref 140–400)
RBC: 2.98 10*6/uL — ABNORMAL LOW (ref 4.20–5.82)
RDW: 16.6 % — ABNORMAL HIGH (ref 11.0–14.6)
WBC: 8.8 10*3/uL (ref 4.0–10.3)

## 2014-07-22 LAB — COMPREHENSIVE METABOLIC PANEL (CC13)
ALK PHOS: 116 U/L (ref 40–150)
ALT: 19 U/L (ref 0–55)
ANION GAP: 12 meq/L — AB (ref 3–11)
AST: 27 U/L (ref 5–34)
Albumin: 2.6 g/dL — ABNORMAL LOW (ref 3.5–5.0)
BILIRUBIN TOTAL: 0.2 mg/dL (ref 0.20–1.20)
BUN: 14.1 mg/dL (ref 7.0–26.0)
CALCIUM: 9.4 mg/dL (ref 8.4–10.4)
CHLORIDE: 107 meq/L (ref 98–109)
CO2: 22 mEq/L (ref 22–29)
CREATININE: 1.4 mg/dL — AB (ref 0.7–1.3)
EGFR: 44 mL/min/{1.73_m2} — AB (ref >90)
Glucose: 128 mg/dl (ref 70–140)
Potassium: 3.7 mEq/L (ref 3.5–5.1)
Sodium: 142 mEq/L (ref 136–145)
Total Protein: 6.3 g/dL — ABNORMAL LOW (ref 6.4–8.3)

## 2014-07-22 LAB — UA PROTEIN, DIPSTICK - CHCC: PROTEIN: 30 mg/dL

## 2014-07-22 NOTE — Progress Notes (Signed)
  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 He completed a second cycle of FOLFOX 07/08/2014 with Neulasta support. Avastin was added with cycle 2. He denies symptoms of venous and arterial thrombosis. He has intermittent rectal bleeding. This has not changed. Dr. [name] complains of feeling "terrible ". He has malaise, a lack of taste for food, and diarrhea. He reports having up to 12 loose stools per day with an average of 6-8. Imodium helps. No rectal pain or dysuria. The severe low back and leg pain has not recurred. He stays home most of the time.  Objective:  Vital signs in last 24 hours:  Blood pressure 138/42, pulse 80, temperature 98 F (36.7 C), temperature source Oral, resp. rate 19, height 5\' 11"  (1.803 m), weight 206 lb 14.4 oz (93.849 kg), SpO2 96 %.    HEENT: No thrush or ulcers Resp: Lungs clear bilaterally Cardio: Regular rate and rhythm GI: No hepatosplenic the, nontender. Rectal-there is a firm mass occupying the anterior rectum extending to the tip of the examination glove. He has pain with rectal exam. There is blood on the examination glove. Vascular: No leg edema   Portacath/PICC-without erythema  Lab Results:  Lab Results  Component Value Date   WBC 8.8 07/22/2014   HGB 9.5* 07/22/2014   HCT 30.0* 07/22/2014   MCV 100.5* 07/22/2014   PLT 311 07/22/2014   NEUTROABS 6.5 07/22/2014   Medications: I have reviewed the patient's current medications. Assessment/plan: 1.Rectal cancer, 1-2 centimeters above the dentate line,uT3N0, invasive adenocarcinoma confirmed on a colonoscopic biopsy 09/03/2012. He began concurrent Xeloda and radiation on 01/13/2013, completed 01/31/2013.   Progress of rectal symptoms, initiation of single agent capecitabine 03/02/2014, 7 days on/7 days off, discontinued after an office visit 04/22/2014  Staging CTs 05/01/2014 with no evidence of  distant metastatic rectal cancer  Cycle 1 FOLFOX 06/16/2014  Cycle 2 FOLFOX 07/08/2014-Avastin added 2. Prostate cancer, status post a prostatectomy in 1993, pelvic radiation in 1998 for a rising PSA, subsequent hormonal therapy and now maintained off of specific therapy with a rising PSA. He is followed by Dr. Rosana James.  3. Left lower lobe 13 mm groundglass nodule on a CT of the chest 09/05/2012. Stable lung nodules on the chest CT 05/01/2014. 4. Sclerotic focus in the T6 vertebra on the chest CT 09/05/2012. Progressive sclerotic bone lesions on the CTs 05/01/2014 5. Rectal urgency and bleeding secondary to #1. 6. Anemia secondary to rectal bleeding, prostate cancer, renal insufficiency, and chemotherapy/radiation 7. Renal insufficiency 8. Pain secondary to rectal cancer-improved 9. Urinary hesitancy-likely secondary to rectal cancer, improved 10. History of Neutropenia following cycle 1 FOLFOX 11. Acute onset low back and bilateral leg pain 06/30/2014, etiology unclear-resolved 12. Fall, syncope event? 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 The rectal pain and dysuria have improved, but his performance status has declined. There is a persistent rectal mass on physical exam. We decided to hold chemotherapy today to see if his energy level, appetite, and diarrhea will improve. We will consider dose reduced chemotherapy versus switching to a comfort/supportive care approach when he returns on 08/03/2014.  We also had a preliminary discussion regarding Hospice care. Tony Coder, MD  07/22/2014  12:58 PM

## 2014-07-22 NOTE — Telephone Encounter (Signed)
Gave avs & calendar for March. °

## 2014-07-24 ENCOUNTER — Ambulatory Visit: Payer: Medicare Other

## 2014-08-03 ENCOUNTER — Telehealth: Payer: Self-pay | Admitting: Oncology

## 2014-08-03 ENCOUNTER — Ambulatory Visit (HOSPITAL_BASED_OUTPATIENT_CLINIC_OR_DEPARTMENT_OTHER): Payer: Medicare Other

## 2014-08-03 ENCOUNTER — Ambulatory Visit (HOSPITAL_BASED_OUTPATIENT_CLINIC_OR_DEPARTMENT_OTHER): Payer: Medicare Other | Admitting: Oncology

## 2014-08-03 VITALS — BP 131/57 | HR 67 | Temp 97.5°F | Resp 18 | Ht 71.0 in | Wt 202.4 lb

## 2014-08-03 DIAGNOSIS — C20 Malignant neoplasm of rectum: Secondary | ICD-10-CM

## 2014-08-03 DIAGNOSIS — C61 Malignant neoplasm of prostate: Secondary | ICD-10-CM

## 2014-08-03 DIAGNOSIS — D630 Anemia in neoplastic disease: Secondary | ICD-10-CM

## 2014-08-03 DIAGNOSIS — N289 Disorder of kidney and ureter, unspecified: Secondary | ICD-10-CM

## 2014-08-03 DIAGNOSIS — D500 Iron deficiency anemia secondary to blood loss (chronic): Secondary | ICD-10-CM

## 2014-08-03 DIAGNOSIS — R5381 Other malaise: Secondary | ICD-10-CM

## 2014-08-03 LAB — CBC WITH DIFFERENTIAL/PLATELET
BASO%: 0.4 % (ref 0.0–2.0)
Basophils Absolute: 0 10*3/uL (ref 0.0–0.1)
EOS ABS: 0 10*3/uL (ref 0.0–0.5)
EOS%: 0.1 % (ref 0.0–7.0)
HEMATOCRIT: 32.5 % — AB (ref 38.4–49.9)
HGB: 10.6 g/dL — ABNORMAL LOW (ref 13.0–17.1)
LYMPH#: 1.7 10*3/uL (ref 0.9–3.3)
LYMPH%: 17.7 % (ref 14.0–49.0)
MCH: 32.6 pg (ref 27.2–33.4)
MCHC: 32.6 g/dL (ref 32.0–36.0)
MCV: 100 fL — ABNORMAL HIGH (ref 79.3–98.0)
MONO#: 1.3 10*3/uL — ABNORMAL HIGH (ref 0.1–0.9)
MONO%: 13.1 % (ref 0.0–14.0)
NEUT%: 68.7 % (ref 39.0–75.0)
NEUTROS ABS: 6.6 10*3/uL — AB (ref 1.5–6.5)
Platelets: 341 10*3/uL (ref 140–400)
RBC: 3.25 10*6/uL — ABNORMAL LOW (ref 4.20–5.82)
RDW: 17.9 % — ABNORMAL HIGH (ref 11.0–14.6)
WBC: 9.6 10*3/uL (ref 4.0–10.3)

## 2014-08-03 LAB — HOLD TUBE, BLOOD BANK

## 2014-08-03 NOTE — Telephone Encounter (Signed)
Gave avs & calendar for March. °

## 2014-08-03 NOTE — Progress Notes (Signed)
  [facility] OFFICE PROGRESS NOTE   Diagnosis: Rectal cancer  INTERVAL HISTORY:   Tony James returns as scheduled. He continues to have profound malaise. He reports intermittent pain at the low back. The diarrhea has resolved. No rectal bleeding. He has a poor appetite. He has noted discomfort in the left upper abdomen.  Objective:  Vital signs in last 24 hours:  Blood pressure 131/57, pulse 67, temperature 97.5 F (36.4 C), temperature source Oral, resp. rate 18, height 5\' 11"  (1.803 m), weight 202 lb 6.4 oz (91.808 kg), SpO2 100 %.   Resp: Lungs clear bilaterally Cardio: Regular rate and rhythm GI: No hepatosplenomegaly, mild diffuse tenderness, no mass Vascular: No leg edema Neuro: Alert and oriented      Lab Results:  Lab Results  Component Value Date   WBC 9.6 08/03/2014   HGB 10.6* 08/03/2014   HCT 32.5* 08/03/2014   MCV 100.0* 08/03/2014   PLT 341 08/03/2014   NEUTROABS 6.6* 08/03/2014     Medications: I have reviewed the patient's current medications.  Assessment/Plan: 1.Rectal cancer, 1-2 centimeters above the dentate line,uT3N0, invasive adenocarcinoma confirmed on a colonoscopic biopsy 09/03/2012. He began concurrent Xeloda and radiation on 01/13/2013, completed 01/31/2013.   Progress of rectal symptoms, initiation of single agent capecitabine 03/02/2014, 7 days on/7 days off, discontinued after an office visit 04/22/2014  Staging CTs 05/01/2014 with no evidence of distant metastatic rectal cancer  Cycle 1 FOLFOX 06/16/2014  Cycle 2 FOLFOX 07/08/2014-Avastin added 2. Prostate cancer, status post a prostatectomy in 1993, pelvic radiation in 1998 for a rising PSA, subsequent hormonal therapy and now maintained off of specific therapy with a rising PSA. He is followed by Dr. Rosana Hoes.  3. Left lower lobe 13 mm groundglass nodule on a CT of the chest 09/05/2012. Stable lung nodules on the chest CT 05/01/2014. 4. Sclerotic focus in the T6  vertebra on the chest CT 09/05/2012. Progressive sclerotic bone lesions on the CTs 05/01/2014 5. Rectal urgency and bleeding secondary to #1. 6. Anemia secondary to rectal bleeding, prostate cancer, renal insufficiency, and chemotherapy/radiation 7. Renal insufficiency 8. Pain secondary to rectal cancer-improved 9. Urinary hesitancy-likely secondary to rectal cancer, improved 10. History of Neutropenia following cycle 1 FOLFOX 11. Acute onset low back and bilateral leg pain 06/30/2014, etiology unclear-resolved 12. Fall, syncope event? 07/01/2014    Disposition:  Tony James continues to have a poor performance status despite being off of chemotherapy for the past 3 weeks. We discussed continuing chemotherapy versus supportive care. He understands his symptoms are likely related to the rectal cancer, but it is possible the prostate cancer has progressed. He would like to proceed with comfort care and is in agreement with a [facility] hospice referral. We discussed CPR and ACLS issues. He will be placed on the CODE BLUE status.  Tony James will return for an office visit in 3 weeks. We will see him sooner as needed.  Betsy Coder, MD  08/03/2014  5:31 PM

## 2014-08-04 LAB — PSA: PSA: 138.6 ng/mL — AB (ref <=4.00)

## 2014-08-06 ENCOUNTER — Other Ambulatory Visit: Payer: Self-pay | Admitting: *Deleted

## 2014-08-07 ENCOUNTER — Telehealth: Payer: Self-pay | Admitting: Oncology

## 2014-08-07 NOTE — Telephone Encounter (Signed)
Flush added to visit with MD per 02/26 POF, s/w pt confirming flush added to next visit.... KJ

## 2014-08-20 ENCOUNTER — Telehealth: Payer: Self-pay | Admitting: *Deleted

## 2014-08-20 ENCOUNTER — Other Ambulatory Visit: Payer: Self-pay | Admitting: *Deleted

## 2014-08-20 MED ORDER — TRAMADOL HCL 50 MG PO TABS: 50.0000 mg | ORAL_TABLET | Freq: Four times a day (QID) | ORAL | Status: AC | PRN

## 2014-08-20 NOTE — Telephone Encounter (Signed)
Hospice nurse called to see if patient can increase Tramadol to 1-2 tablets every 6 hours prn pain. (Currently takes 1 tablet every 6 hours prn) Is having increased abdominal/back and rectal pain. Has 12 tablets left. If OK to increase, please fax rx to [facility]

## 2014-08-20 NOTE — Telephone Encounter (Signed)
Tramadol Rx called to [facility] per hospice RN's request. Notified Malachy Mood, RN. She reports patient wanted to continue Tramadol for now as opposed to stronger, narcotic analgesic.

## 2014-08-21 ENCOUNTER — Telehealth: Payer: Self-pay | Admitting: *Deleted

## 2014-08-21 ENCOUNTER — Encounter: Payer: Self-pay | Admitting: Oncology

## 2014-08-21 NOTE — Telephone Encounter (Signed)
Request for prior authorization for Tramadol given to [facility].

## 2014-08-21 NOTE — Progress Notes (Signed)
I faxed pri auth requests to medicare([employer]) for tramdaol hcl 50 mg tablet 75/9   Take 1 to 2 tablets by mouth every 6 hours as needed for pain

## 2014-08-24 ENCOUNTER — Encounter: Payer: Self-pay | Admitting: Oncology

## 2014-08-24 NOTE — Progress Notes (Signed)
Per [employer]- denied because of Hospice--dates 08/13/14-11/01/14 and they won't pre-auth the Tramadol hcl 80mg . I will advise the nurse. It was also denied under Medicare part D

## 2014-08-25 ENCOUNTER — Ambulatory Visit (HOSPITAL_BASED_OUTPATIENT_CLINIC_OR_DEPARTMENT_OTHER): Payer: Medicare Other | Admitting: Oncology

## 2014-08-25 ENCOUNTER — Telehealth: Payer: Self-pay | Admitting: Oncology

## 2014-08-25 ENCOUNTER — Ambulatory Visit (HOSPITAL_BASED_OUTPATIENT_CLINIC_OR_DEPARTMENT_OTHER)

## 2014-08-25 VITALS — BP 126/69 | HR 70 | Temp 98.5°F | Resp 18 | Ht 71.0 in | Wt 203.7 lb

## 2014-08-25 DIAGNOSIS — C20 Malignant neoplasm of rectum: Secondary | ICD-10-CM | POA: Diagnosis not present

## 2014-08-25 DIAGNOSIS — D6481 Anemia due to antineoplastic chemotherapy: Secondary | ICD-10-CM | POA: Diagnosis not present

## 2014-08-25 DIAGNOSIS — D630 Anemia in neoplastic disease: Secondary | ICD-10-CM | POA: Diagnosis not present

## 2014-08-25 DIAGNOSIS — Z452 Encounter for adjustment and management of vascular access device: Secondary | ICD-10-CM | POA: Diagnosis not present

## 2014-08-25 DIAGNOSIS — Z95828 Presence of other vascular implants and grafts: Secondary | ICD-10-CM

## 2014-08-25 DIAGNOSIS — Z8546 Personal history of malignant neoplasm of prostate: Secondary | ICD-10-CM

## 2014-08-25 MED ORDER — HEPARIN SOD (PORK) LOCK FLUSH 100 UNIT/ML IV SOLN
500.0000 [IU] | Freq: Once | INTRAVENOUS | Status: AC
  Administered 2014-08-25: 500 [IU] via INTRAVENOUS
  Filled 2014-08-25: qty 5

## 2014-08-25 MED ORDER — SODIUM CHLORIDE 0.9 % IJ SOLN
10.0000 mL | INTRAMUSCULAR | Status: DC | PRN
  Administered 2014-08-25: 10 mL via INTRAVENOUS
  Filled 2014-08-25: qty 10

## 2014-08-25 NOTE — Patient Instructions (Signed)

## 2014-08-25 NOTE — Progress Notes (Signed)
  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 He continues to have malaise. Rectal pain is relieved with tramadol. He has developed pain at the mid upper back with a deep inspiration. He thinks this pain may be related to prostate cancer involving the thoracic spine. Dr. Lorenza Chick has enrolled in the [facility] hospice program. The hospice nurse is visiting weekly.  Objective:  Vital signs in last 24 hours:  Blood pressure 126/69, pulse 70, temperature 98.5 F (36.9 C), temperature source Oral, resp. rate 18, height 5\' 11"  (1.803 m), weight 203 lb 11.2 oz (92.398 kg), SpO2 98 %.    Resp: Lungs clear bilaterally Cardio: Regular rate and rhythm GI: No hepatomegaly, mild diffuse tenderness, no mass Vascular: No leg edema Musculoskeletal: No tenderness at the thoracic spine     Lab Results:  Lab Results  Component Value Date   WBC 9.6 08/03/2014   HGB 10.6* 08/03/2014   HCT 32.5* 08/03/2014   MCV 100.0* 08/03/2014   PLT 341 08/03/2014   NEUTROABS 6.6* 08/03/2014   Medications: I have reviewed the patient's current medications.  Assessment/Plan: 1.Rectal cancer, 1-2 centimeters above the dentate line,uT3N0, invasive adenocarcinoma confirmed on a colonoscopic biopsy 09/03/2012. He began concurrent Xeloda and radiation on 01/13/2013, completed 01/31/2013.   Progress of rectal symptoms, initiation of single agent capecitabine 03/02/2014, 7 days on/7 days off, discontinued after an office visit 04/22/2014  Staging CTs 05/01/2014 with no evidence of distant metastatic rectal cancer  Cycle 1 FOLFOX 06/16/2014  Cycle 2 FOLFOX 07/08/2014-Avastin added 2. Prostate cancer, status post a prostatectomy in 1993, pelvic radiation in 1998 for a rising PSA, subsequent hormonal therapy and now maintained off of specific therapy with a rising PSA. He is followed by Dr. Rosana Hoes.  3. Left lower lobe 13 mm  groundglass nodule on a CT of the chest 09/05/2012. Stable lung nodules on the chest CT 05/01/2014. 4. Sclerotic focus in the T6 vertebra on the chest CT 09/05/2012. Progressive sclerotic bone lesions on the CTs 05/01/2014 5. Rectal urgency and bleeding secondary to #1. 6. Anemia secondary to rectal bleeding, prostate cancer, renal insufficiency, and chemotherapy/radiation 7. Renal insufficiency 8. Pain secondary to rectal cancer 9. Urinary hesitancy-likely secondary to rectal cancer 10. History of Neutropenia following cycle 1 FOLFOX 11. Acute onset low back and bilateral leg pain 06/30/2014, etiology unclear-resolved 12. Fall, syncope event? 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 He will contact us for increased pain at the thoracic spine. The pain may be related to prostate cancer. We will consider palliative radiation if this pain persists. He will continue follow-up with the Hospice RN. Dr. Lorenza Chick will return for an office visit in one month.  Betsy Coder, MD  08/25/2014  1:45 PM

## 2014-08-25 NOTE — Telephone Encounter (Signed)
gv and printed appt sched and avs for pt for April  °

## 2014-09-17 NOTE — Telephone Encounter (Signed)
error 

## 2014-09-22 ENCOUNTER — Ambulatory Visit (HOSPITAL_BASED_OUTPATIENT_CLINIC_OR_DEPARTMENT_OTHER): Payer: Medicare Other | Admitting: Oncology

## 2014-09-22 ENCOUNTER — Telehealth: Payer: Self-pay | Admitting: Oncology

## 2014-09-22 ENCOUNTER — Other Ambulatory Visit: Payer: Self-pay | Admitting: *Deleted

## 2014-09-22 VITALS — BP 140/67 | HR 79 | Temp 98.4°F | Resp 18 | Ht 71.0 in | Wt 198.6 lb

## 2014-09-22 DIAGNOSIS — M899 Disorder of bone, unspecified: Secondary | ICD-10-CM | POA: Diagnosis not present

## 2014-09-22 DIAGNOSIS — R3911 Hesitancy of micturition: Secondary | ICD-10-CM | POA: Diagnosis not present

## 2014-09-22 DIAGNOSIS — R634 Abnormal weight loss: Secondary | ICD-10-CM

## 2014-09-22 DIAGNOSIS — Z8546 Personal history of malignant neoplasm of prostate: Secondary | ICD-10-CM | POA: Diagnosis not present

## 2014-09-22 DIAGNOSIS — K625 Hemorrhage of anus and rectum: Secondary | ICD-10-CM | POA: Diagnosis not present

## 2014-09-22 DIAGNOSIS — R911 Solitary pulmonary nodule: Secondary | ICD-10-CM

## 2014-09-22 DIAGNOSIS — D500 Iron deficiency anemia secondary to blood loss (chronic): Secondary | ICD-10-CM

## 2014-09-22 DIAGNOSIS — C20 Malignant neoplasm of rectum: Secondary | ICD-10-CM

## 2014-09-22 DIAGNOSIS — D6481 Anemia due to antineoplastic chemotherapy: Secondary | ICD-10-CM | POA: Diagnosis not present

## 2014-09-22 DIAGNOSIS — D649 Anemia, unspecified: Secondary | ICD-10-CM | POA: Diagnosis not present

## 2014-09-22 DIAGNOSIS — G893 Neoplasm related pain (acute) (chronic): Secondary | ICD-10-CM | POA: Diagnosis not present

## 2014-09-22 DIAGNOSIS — N289 Disorder of kidney and ureter, unspecified: Secondary | ICD-10-CM

## 2014-09-22 NOTE — Progress Notes (Signed)
  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 He is followed by the [facility] program. The abdomen and rectal pain is controlled with tramadol. He has irregular bowel habits. He develops diarrhea when he takes MiraLAX. He has pain at the upper back with a deep inspiration. No other back pain. He reports erythema at the perineum without skin breakdown.  Objective:  Vital signs in last 24 hours:  Blood pressure 140/67, pulse 79, temperature 98.4 F (36.9 C), temperature source Oral, resp. rate 18, height 5\' 11"  (1.803 m), weight 198 lb 9.6 oz (90.084 kg), SpO2 97 %.  Resp: Lungs clear bilaterally Cardio: Regular rate and rhythm GI: Mild diffuse tenderness, no hepatomegaly, no mass Vascular: No leg edema   Portacath/PICC-without erythema   Medications: I have reviewed the patient's current medications.  Assessment/Plan: 1.Rectal cancer, 1-2 centimeters above the dentate line,uT3N0, invasive adenocarcinoma confirmed on a colonoscopic biopsy 09/03/2012. He began concurrent Xeloda and radiation on 01/13/2013, completed 01/31/2013.   Progress of rectal symptoms, initiation of single agent capecitabine 03/02/2014, 7 days on/7 days off, discontinued after an office visit 04/22/2014  Staging CTs 05/01/2014 with no evidence of distant metastatic rectal cancer  Cycle 1 FOLFOX 06/16/2014  Cycle 2 FOLFOX 07/08/2014-Avastin added 2. Prostate cancer, status post a prostatectomy in 1993, pelvic radiation in 1998 for a rising PSA, subsequent hormonal therapy and now maintained off of specific therapy with a rising PSA. He is followed by Dr. Rosana Hoes.  3. Left lower lobe 13 mm groundglass nodule on a CT of the chest 09/05/2012. Stable lung nodules on the chest CT 05/01/2014. 4. Sclerotic focus in the T6 vertebra on the chest CT 09/05/2012. Progressive sclerotic bone lesions on the CTs 05/01/2014 5.  Rectal urgency and bleeding secondary to #1. 6. Anemia secondary to rectal bleeding, prostate cancer, renal insufficiency, and chemotherapy/radiation 7. Renal insufficiency 8. Pain secondary to rectal cancer 9. Urinary hesitancy-likely secondary to rectal cancer 10. History of Neutropenia following cycle 1 FOLFOX 11. Acute onset low back and bilateral leg pain 06/30/2014, etiology unclear-resolved 12. Fall, syncope event? 07/01/2014   Disposition:  His overall status appears stable. He is losing weight. The plan is to continue the current medical regimen. He will stop MiraLAX an increase the Senokot to 3 times daily. Dr. Lorenza Chick will return for an office visit in one month.  Betsy Coder, MD  09/22/2014  11:43 AM

## 2014-09-22 NOTE — Telephone Encounter (Signed)
gave adn printed appt sched adn avs for pt for May

## 2014-10-01 ENCOUNTER — Telehealth: Payer: Self-pay

## 2014-10-01 MED ORDER — OXYCODONE-ACETAMINOPHEN 5-325 MG PO TABS: 1.0000 | ORAL_TABLET | ORAL | Status: DC | PRN

## 2014-10-01 NOTE — Telephone Encounter (Signed)
monisha from hospice called stating that the pt's abd pain is increasing. He is needing to use 100 mg tramadol q 6 hrs. It is "taking the edge off" his pain. Can he receive something stronger? Please fax Rx to [facility] outpatient pharmacy.

## 2014-10-01 NOTE — Telephone Encounter (Signed)
Order received from Dr. Benay Spice for Oxycodone/APAP 5/325 Q4 hours PRN pain. Rx will be faxed to [place].

## 2014-10-01 NOTE — Addendum Note (Signed)
Addended by: Brien Few on: 10/01/2014 05:10 PM   Modules accepted: Orders

## 2014-10-22 ENCOUNTER — Telehealth: Payer: Self-pay | Admitting: Oncology

## 2014-10-22 ENCOUNTER — Ambulatory Visit (HOSPITAL_BASED_OUTPATIENT_CLINIC_OR_DEPARTMENT_OTHER): Payer: Medicare Other | Admitting: Oncology

## 2014-10-22 ENCOUNTER — Ambulatory Visit (HOSPITAL_BASED_OUTPATIENT_CLINIC_OR_DEPARTMENT_OTHER)

## 2014-10-22 ENCOUNTER — Other Ambulatory Visit: Payer: Self-pay | Admitting: *Deleted

## 2014-10-22 VITALS — BP 121/83 | HR 70 | Temp 98.0°F | Resp 18 | Ht 71.0 in | Wt 192.6 lb

## 2014-10-22 DIAGNOSIS — R911 Solitary pulmonary nodule: Secondary | ICD-10-CM

## 2014-10-22 DIAGNOSIS — M899 Disorder of bone, unspecified: Secondary | ICD-10-CM

## 2014-10-22 DIAGNOSIS — G893 Neoplasm related pain (acute) (chronic): Secondary | ICD-10-CM | POA: Diagnosis not present

## 2014-10-22 DIAGNOSIS — C20 Malignant neoplasm of rectum: Secondary | ICD-10-CM

## 2014-10-22 DIAGNOSIS — D6481 Anemia due to antineoplastic chemotherapy: Secondary | ICD-10-CM | POA: Diagnosis not present

## 2014-10-22 DIAGNOSIS — N289 Disorder of kidney and ureter, unspecified: Secondary | ICD-10-CM

## 2014-10-22 DIAGNOSIS — Z8546 Personal history of malignant neoplasm of prostate: Secondary | ICD-10-CM

## 2014-10-22 DIAGNOSIS — R3911 Hesitancy of micturition: Secondary | ICD-10-CM

## 2014-10-22 DIAGNOSIS — Z95828 Presence of other vascular implants and grafts: Secondary | ICD-10-CM

## 2014-10-22 MED ORDER — OXYCODONE HCL 10 MG PO TABS: 10.0000 mg | ORAL_TABLET | ORAL | Status: DC | PRN

## 2014-10-22 MED ORDER — SODIUM CHLORIDE 0.9 % IJ SOLN
10.0000 mL | INTRAMUSCULAR | Status: DC | PRN
  Administered 2014-10-22: 10 mL via INTRAVENOUS
  Filled 2014-10-22: qty 10

## 2014-10-22 MED ORDER — HEPARIN SOD (PORK) LOCK FLUSH 100 UNIT/ML IV SOLN
500.0000 [IU] | Freq: Once | INTRAVENOUS | Status: AC
  Administered 2014-10-22: 500 [IU] via INTRAVENOUS
  Filled 2014-10-22: qty 5

## 2014-10-22 NOTE — Telephone Encounter (Signed)
per pof ot sch pt appt-gave pt copy of sch °

## 2014-10-22 NOTE — Telephone Encounter (Signed)
mailed pt updated copy-had wrong MD

## 2014-10-22 NOTE — Patient Instructions (Signed)

## 2014-10-22 NOTE — Progress Notes (Signed)
No blood return noted from [facility] flush today.  Dr Benay Spice made aware.  Pt states that the last time he was flushed there was no blood return then either.

## 2014-10-22 NOTE — Progress Notes (Signed)
  [place] OFFICE PROGRESS NOTE   Diagnosis: Rectal cancer  INTERVAL HISTORY:   Tony James returns as scheduled. He reports anorexia. He complains of increased pain in the rectal area and low abdomen. The pain is generally relieved with tramadol and Percocet, but neither relieved his pain yesterday. The pain is improved today. His bowels are moving. He has urinary and fecal incontinence. The hospice nurse is visiting weekly.  Objective:  Vital signs in last 24 hours:  Blood pressure 121/83, pulse 70, temperature 98 F (36.7 C), temperature source Oral, resp. rate 18, height 5\' 11"  (1.803 m), weight 192 lb 9.6 oz (87.363 kg), SpO2 99 %.    Resp: Lungs clear bilaterally Cardio: Regular rate and rhythm GI: No hepatosplenomegaly, no mass. Mild diffuse tenderness. The abdomen is soft. Vascular: No leg edema. Neuro: Alert and oriented, he ambulates to the exam table   Medications: I have reviewed the patient's current medications.  Assessment/Plan: 1. Rectal cancer, 1-2 centimeters above the dentate line,uT3N0, invasive adenocarcinoma confirmed on a colonoscopic biopsy 09/03/2012. He began concurrent Xeloda and radiation on 01/13/2013, completed 01/31/2013.   Progress of rectal symptoms, initiation of single agent capecitabine 03/02/2014, 7 days on/7 days off, discontinued after an office visit 04/22/2014  Staging CTs 05/01/2014 with no evidence of distant metastatic rectal cancer  Cycle 1 FOLFOX 06/16/2014  Cycle 2 FOLFOX 07/08/2014-Avastin added 2. Prostate cancer, status post a prostatectomy in 1993, pelvic radiation in 1998 for a rising PSA, subsequent hormonal therapy and now maintained off of specific therapy with a rising PSA. He is followed by Dr. Rosana Hoes.  3. Left lower lobe 13 mm groundglass nodule on a CT of the chest 09/05/2012. Stable lung nodules on the chest CT 05/01/2014. 4. Sclerotic focus in the T6 vertebra on the chest CT 09/05/2012. Progressive  sclerotic bone lesions on the CTs 05/01/2014 5. Rectal urgency and bleeding secondary to #1. 6. Anemia secondary to rectal bleeding, prostate cancer, renal insufficiency, and chemotherapy/radiation 7. Renal insufficiency 8. Pain secondary to rectal cancer 9. Urinary hesitancy-likely secondary to rectal cancer 10. History of Neutropenia following cycle 1 FOLFOX 11. Acute onset low back and bilateral leg pain 06/30/2014, etiology unclear-resolved 12. Fall, syncope event? 07/01/2014   Disposition:  Tony James has metastatic rectal cancer. He appears to be slowly declining. He is enrolled in the [facility] program. He has increased pain secondary to the rectal tumor. We added 10 mg oxycodone, 1-2 every 4 hours as needed. He will contact us if he has consistent pain and we will add a long acting narcotic.  Tony James will return for an office visit and Port-A-Cath flush in approximately one month.  Betsy Coder, MD  10/22/2014  9:08 AM

## 2014-11-20 ENCOUNTER — Other Ambulatory Visit: Payer: Self-pay | Admitting: *Deleted

## 2014-11-20 MED ORDER — OXYCODONE HCL 10 MG PO TABS: 10.0000 mg | ORAL_TABLET | ORAL | Status: DC | PRN

## 2014-11-20 NOTE — Telephone Encounter (Signed)
Hospice RN called requested refill of pt Oxycodone 10 mg be sent to [place].  Faxed refill script as requested.

## 2014-11-25 ENCOUNTER — Ambulatory Visit (HOSPITAL_BASED_OUTPATIENT_CLINIC_OR_DEPARTMENT_OTHER): Admitting: Oncology

## 2014-11-25 ENCOUNTER — Ambulatory Visit: Payer: Medicare Other

## 2014-11-25 ENCOUNTER — Telehealth: Payer: Self-pay | Admitting: Oncology

## 2014-11-25 ENCOUNTER — Ambulatory Visit: Payer: Medicare Other | Admitting: Internal Medicine

## 2014-11-25 VITALS — BP 103/71 | HR 73 | Temp 98.6°F | Resp 18 | Ht 71.0 in | Wt 187.1 lb

## 2014-11-25 DIAGNOSIS — C20 Malignant neoplasm of rectum: Secondary | ICD-10-CM

## 2014-11-25 DIAGNOSIS — Z95828 Presence of other vascular implants and grafts: Secondary | ICD-10-CM

## 2014-11-25 DIAGNOSIS — N289 Disorder of kidney and ureter, unspecified: Secondary | ICD-10-CM

## 2014-11-25 DIAGNOSIS — G893 Neoplasm related pain (acute) (chronic): Secondary | ICD-10-CM

## 2014-11-25 DIAGNOSIS — R3911 Hesitancy of micturition: Secondary | ICD-10-CM

## 2014-11-25 DIAGNOSIS — R918 Other nonspecific abnormal finding of lung field: Secondary | ICD-10-CM

## 2014-11-25 DIAGNOSIS — D500 Iron deficiency anemia secondary to blood loss (chronic): Secondary | ICD-10-CM

## 2014-11-25 DIAGNOSIS — M899 Disorder of bone, unspecified: Secondary | ICD-10-CM | POA: Diagnosis not present

## 2014-11-25 DIAGNOSIS — D6481 Anemia due to antineoplastic chemotherapy: Secondary | ICD-10-CM | POA: Diagnosis not present

## 2014-11-25 DIAGNOSIS — D649 Anemia, unspecified: Secondary | ICD-10-CM

## 2014-11-25 DIAGNOSIS — Z8546 Personal history of malignant neoplasm of prostate: Secondary | ICD-10-CM

## 2014-11-25 DIAGNOSIS — K625 Hemorrhage of anus and rectum: Secondary | ICD-10-CM

## 2014-11-25 MED ORDER — SODIUM CHLORIDE 0.9 % IJ SOLN
10.0000 mL | INTRAMUSCULAR | Status: DC | PRN
  Administered 2014-11-25: 10 mL via INTRAVENOUS
  Filled 2014-11-25: qty 10

## 2014-11-25 MED ORDER — HEPARIN SOD (PORK) LOCK FLUSH 100 UNIT/ML IV SOLN
500.0000 [IU] | Freq: Once | INTRAVENOUS | Status: AC
  Administered 2014-11-25: 500 [IU] via INTRAVENOUS
  Filled 2014-11-25: qty 5

## 2014-11-25 NOTE — Progress Notes (Signed)
  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 He continues to have pain in the low abdomen and rectal region. The pain is relieved with oxycodone. His bowels are moving. He reports a poor appetite and increasing generalized weakness. The hospice nurses are visiting weekly.  Objective:  Vital signs in last 24 hours:  Blood pressure 103/71, pulse 73, temperature 98.6 F (37 C), temperature source Oral, resp. rate 18, height 5\' 11"  (1.803 m), weight 187 lb 1.6 oz (84.868 kg), SpO2 99 %.    Resp: Lungs clear bilaterally Cardio: Regular rate and rhythm GI: The abdomen is soft, diffuse tenderness in the low abdomen, no mass, no hepatomegaly Vascular: No leg edema  Medications: I have reviewed the patient's current medications.  Assessment/Plan: 1. Rectal cancer, 1-2 centimeters above the dentate line,uT3N0, invasive adenocarcinoma confirmed on a colonoscopic biopsy1 09/03/2012. He began concurrent Xeloda and radiation on 01/13/2013, completed 01/31/2013.   Progress of rectal symptoms, initiation of single agent capecitabine 03/02/2014, 7 days on/7 days off, discontinued after an office visit 04/22/2014  Staging CTs 05/01/2014 with no evidence of distant metastatic rectal cancer  Cycle 1 FOLFOX 06/16/2014  Cycle 2 FOLFOX 07/08/2014-Avastin added 2. Prostate cancer, status post a prostatectomy in 1993, pelvic radiation in 1998 for a rising PSA, subsequent hormonal therapy and now maintained off of specific therapy with a rising PSA. He is followed by Dr. Rosana Hoes.  3. Left lower lobe 13 mm groundglass nodule on a CT of the chest 09/05/2012. Stable lung nodules on the chest CT 05/01/2014. 4. Sclerotic focus in the T6 vertebra on the chest CT 09/05/2012. Progressive sclerotic bone lesions on the CTs 05/01/2014 5. Rectal urgency and bleeding secondary to #1. 6. Anemia secondary to rectal bleeding, prostate  cancer, renal insufficiency, and chemotherapy/radiation 7. Renal insufficiency 8. Pain secondary to rectal cancer 9. Urinary hesitancy-likely secondary to rectal cancer 10. History of Neutropenia following cycle 1 FOLFOX 11. Acute onset low back and bilateral leg pain 06/30/2014, etiology unclear-resolved 12. Fall, syncope event? 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 The pain is relieved with oxycodone. He will decrease the atenolol dose to 25 mg daily. He continues follow-up with the [facility] program. He will return for an office visit in one month. He will contact us in the interim for new symptoms.Marland Kitchen  Betsy Coder, MD  11/25/2014  8:17 PM

## 2014-11-25 NOTE — Telephone Encounter (Signed)
per pof ot sch pt appt-gave pt avs

## 2014-11-25 NOTE — Patient Instructions (Signed)

## 2014-12-23 ENCOUNTER — Encounter

## 2014-12-23 ENCOUNTER — Telehealth: Payer: Self-pay | Admitting: *Deleted

## 2014-12-23 ENCOUNTER — Ambulatory Visit: Admitting: Oncology

## 2014-12-23 NOTE — Telephone Encounter (Signed)
Oncology Nurse Navigator Documentation  Oncology Nurse Navigator Flowsheets 12/23/2014  Navigator Encounter Type Telephone--F/U no show today  Barriers/Navigation Needs Education-Fall Prevention  Reports being weak and daughter declined to bring him because it is too difficult to get him in the car. Hospice nurse, Leighton Ruff comes every Thursday. His pain is controlled. Not eating much, but drinking fluids-no protein drinks or supplements. Has had some occasional falls even when he ambulates w/walker. Gets dizzy at times and sometimes legs just "give out". Encouraged him to try to increase fluids and protein for strength and dizziness could be related to dehydration. Encouraged him to have his wife ambulate beside him for safety (she is in good health). [facility] is attentive and he has emergency call system if needed. Feels he is "stable" and has no needs at this time.

## 2014-12-24 ENCOUNTER — Other Ambulatory Visit: Payer: Self-pay | Admitting: *Deleted

## 2014-12-24 DIAGNOSIS — C20 Malignant neoplasm of rectum: Secondary | ICD-10-CM

## 2014-12-24 MED ORDER — OXYCODONE HCL 10 MG PO TABS: 10.0000 mg | ORAL_TABLET | ORAL | Status: DC | PRN

## 2014-12-24 NOTE — Telephone Encounter (Signed)
Refill: Oxycodone: faxed to [facility]. Monisha RN from Hospice/Palliative called stating that patient was complaining about blood in the urine (small amount) and a lump being felt when he swallows. Patient is still able to eat and once it gets over the lump then digesting is better. MD Benay Spice notified and states that if he does need any liquid medication then to let him know.

## 2014-12-29 ENCOUNTER — Telehealth: Payer: Self-pay | Admitting: *Deleted

## 2014-12-29 MED ORDER — OXYCODONE HCL 20 MG/ML PO CONC: 6.0000 mg | ORAL | Status: DC | PRN

## 2014-12-29 NOTE — Telephone Encounter (Signed)
Script faxed to [facility] as below. Monisha RN with Hospice made aware and will follow up.

## 2014-12-29 NOTE — Telephone Encounter (Signed)
Ok Please send script for oxyfast 20mg /cc, 0.25-0.5 cc q4hrs prn

## 2014-12-29 NOTE — Telephone Encounter (Signed)
TC from patient's Hospice nurse. Tony James states that pt is having more trouble swallowing and is asking to change oxycodone 10 mg tabs to oxycodone liquid (oxyfast). He has been taking 1 tablet every 4-6 hours. Prescription would need to be fax'd to [facility] with "Hospice Patient" written on the prescription. Call Hospice RN when prescription has been fax'd

## 2015-01-01 ENCOUNTER — Telehealth: Payer: Self-pay | Admitting: Oncology

## 2015-01-01 NOTE — Telephone Encounter (Signed)
Pt's wife called pt is to weak to come in cancelled apts and I sent a msg to Dr. Benay Spice pertaining to this matter... KJ

## 2015-01-04 ENCOUNTER — Encounter (HOSPITAL_COMMUNITY): Payer: Self-pay | Admitting: *Deleted

## 2015-01-04 ENCOUNTER — Emergency Department (HOSPITAL_COMMUNITY)

## 2015-01-04 ENCOUNTER — Ambulatory Visit: Admitting: Oncology

## 2015-01-04 ENCOUNTER — Emergency Department (HOSPITAL_COMMUNITY)
Admission: EM | Admit: 2015-01-04 | Discharge: 2015-01-04 | Disposition: A | Discharge status: Home / Self Care | Attending: Emergency Medicine | Admitting: Emergency Medicine

## 2015-01-04 ENCOUNTER — Encounter

## 2015-01-04 DIAGNOSIS — Z79899 Other long term (current) drug therapy: Secondary | ICD-10-CM | POA: Diagnosis not present

## 2015-01-04 DIAGNOSIS — I10 Essential (primary) hypertension: Secondary | ICD-10-CM | POA: Diagnosis not present

## 2015-01-04 DIAGNOSIS — R197 Diarrhea, unspecified: Secondary | ICD-10-CM | POA: Diagnosis present

## 2015-01-04 DIAGNOSIS — C20 Malignant neoplasm of rectum: Secondary | ICD-10-CM | POA: Diagnosis not present

## 2015-01-04 DIAGNOSIS — Z8546 Personal history of malignant neoplasm of prostate: Secondary | ICD-10-CM | POA: Insufficient documentation

## 2015-01-04 DIAGNOSIS — Z9889 Other specified postprocedural states: Secondary | ICD-10-CM | POA: Diagnosis not present

## 2015-01-04 DIAGNOSIS — I119 Hypertensive heart disease without heart failure: Secondary | ICD-10-CM | POA: Diagnosis not present

## 2015-01-04 DIAGNOSIS — N390 Urinary tract infection, site not specified: Secondary | ICD-10-CM | POA: Diagnosis not present

## 2015-01-04 DIAGNOSIS — Z923 Personal history of irradiation: Secondary | ICD-10-CM | POA: Insufficient documentation

## 2015-01-04 DIAGNOSIS — E860 Dehydration: Secondary | ICD-10-CM

## 2015-01-04 DIAGNOSIS — Z85038 Personal history of other malignant neoplasm of large intestine: Secondary | ICD-10-CM | POA: Insufficient documentation

## 2015-01-04 LAB — COMPREHENSIVE METABOLIC PANEL
ALT: 10 U/L — AB (ref 17–63)
ANION GAP: 13 (ref 5–15)
AST: 14 U/L — AB (ref 15–41)
Albumin: 2.4 g/dL — ABNORMAL LOW (ref 3.5–5.0)
Alkaline Phosphatase: 82 U/L (ref 38–126)
BUN: 42 mg/dL — AB (ref 6–20)
CO2: 21 mmol/L — AB (ref 22–32)
Calcium: 9.6 mg/dL (ref 8.9–10.3)
Chloride: 107 mmol/L (ref 101–111)
Creatinine, Ser: 1.27 mg/dL — ABNORMAL HIGH (ref 0.61–1.24)
GFR calc Af Amer: 56 mL/min — ABNORMAL LOW (ref >60)
GFR calc non Af Amer: 48 mL/min — ABNORMAL LOW (ref >60)
Glucose, Bld: 128 mg/dL — ABNORMAL HIGH (ref 65–99)
Potassium: 3.6 mmol/L (ref 3.5–5.1)
SODIUM: 141 mmol/L (ref 135–145)
Total Bilirubin: 1.1 mg/dL (ref 0.3–1.2)
Total Protein: 6.4 g/dL — ABNORMAL LOW (ref 6.5–8.1)

## 2015-01-04 LAB — CBC WITH DIFFERENTIAL/PLATELET
BASOS ABS: 0 10*3/uL (ref 0.0–0.1)
Basophils Relative: 0 % (ref 0–1)
Eosinophils Absolute: 0 10*3/uL (ref 0.0–0.7)
Eosinophils Relative: 0 % (ref 0–5)
HEMATOCRIT: 36.6 % — AB (ref 39.0–52.0)
HEMOGLOBIN: 11.6 g/dL — AB (ref 13.0–17.0)
LYMPHS ABS: 0.6 10*3/uL — AB (ref 0.7–4.0)
Lymphocytes Relative: 4 % — ABNORMAL LOW (ref 12–46)
MCH: 30.4 pg (ref 26.0–34.0)
MCHC: 31.7 g/dL (ref 30.0–36.0)
MCV: 96.1 fL (ref 78.0–100.0)
Monocytes Absolute: 0.9 10*3/uL (ref 0.1–1.0)
Monocytes Relative: 6 % (ref 3–12)
Neutro Abs: 13.2 10*3/uL — ABNORMAL HIGH (ref 1.7–7.7)
Neutrophils Relative %: 90 % — ABNORMAL HIGH (ref 43–77)
PLATELETS: 392 10*3/uL (ref 150–400)
RBC: 3.81 MIL/uL — ABNORMAL LOW (ref 4.22–5.81)
RDW: 15.8 % — ABNORMAL HIGH (ref 11.5–15.5)
WBC: 14.7 10*3/uL — AB (ref 4.0–10.5)

## 2015-01-04 LAB — URINALYSIS, ROUTINE W REFLEX MICROSCOPIC
GLUCOSE, UA: NEGATIVE mg/dL
KETONES UR: 15 mg/dL — AB
NITRITE: NEGATIVE
PH: 8 (ref 5.0–8.0)
Protein, ur: >300 mg/dL — AB
SPECIFIC GRAVITY, URINE: 1.017 (ref 1.005–1.030)
Urobilinogen, UA: 1 mg/dL (ref 0.0–1.0)

## 2015-01-04 LAB — URINE MICROSCOPIC-ADD ON

## 2015-01-04 LAB — LIPASE, BLOOD: Lipase: 30 U/L (ref 22–51)

## 2015-01-04 MED ORDER — LORAZEPAM 0.5 MG PO TABS: 0.5000 mg | ORAL_TABLET | ORAL | Status: AC | PRN

## 2015-01-04 MED ORDER — SODIUM CHLORIDE 0.9 % IV SOLN
1000.0000 mL | Freq: Once | INTRAVENOUS | Status: AC
  Administered 2015-01-04: 1000 mL via INTRAVENOUS

## 2015-01-04 MED ORDER — ONDANSETRON HCL 4 MG/2ML IJ SOLN
4.0000 mg | Freq: Once | INTRAMUSCULAR | Status: DC
  Filled 2015-01-04: qty 2

## 2015-01-04 MED ORDER — OXYCODONE HCL 5 MG PO TABS
10.0000 mg | ORAL_TABLET | ORAL | Status: DC | PRN
  Administered 2015-01-04 (×2): 10 mg via ORAL
  Filled 2015-01-04: qty 4

## 2015-01-04 MED ORDER — OXYCODONE HCL 20 MG/ML PO CONC: 6.0000 mg | ORAL | Status: AC | PRN

## 2015-01-04 MED ORDER — DEXTROSE 5 % IV SOLN
1.0000 g | INTRAVENOUS | Status: DC
  Administered 2015-01-04: 1 g via INTRAVENOUS
  Filled 2015-01-04: qty 10

## 2015-01-04 MED ORDER — SODIUM CHLORIDE 0.9 % IV SOLN: 1000.0000 mL | INTRAVENOUS | Status: DC

## 2015-01-04 MED ORDER — HYDROMORPHONE HCL 1 MG/ML IJ SOLN
1.0000 mg | INTRAMUSCULAR | Status: DC | PRN
  Administered 2015-01-04: 1 mg via INTRAVENOUS
  Filled 2015-01-04: qty 1

## 2015-01-04 MED ORDER — OXYCODONE HCL 20 MG/ML PO CONC: 6.0000 mg | ORAL | Status: DC | PRN

## 2015-01-04 MED ORDER — CEPHALEXIN 500 MG PO CAPS: 500.0000 mg | ORAL_CAPSULE | Freq: Four times a day (QID) | ORAL | Status: AC
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
## 2015-01-04 NOTE — ED Notes (Addendum)
Diarrhea x 1 week. Hypotension on EMS arrival - 68/40 sitting and 90 systolic lying down.   From independent living at [place].  Hx of rectal and prostate ca, baseline - pos hemocult. Pt reports diarrhea has been going on x 2 years, is related to his cancer.

## 2015-01-04 NOTE — ED Notes (Signed)
Patient transported to X-ray 

## 2015-01-04 NOTE — Progress Notes (Signed)
CSW attempted to meet with patient at bedside. However, he is asleep. Wife and daughter were present. Family confirms that the pt is from [facility]. Family states that the pt lives in the facilities independent living unit and has been staying there for 1 year.   Per note, patient presents due to diarrhea and hypotension.   Family states that they are interested in the pt going to the facilities rehab unit upon discharge today. Family states that they want the pt to stay in that unit over night and return back to independent living. The pt receives hospice care. His hospice agency is [facility] and Palliative care. CSW called the facility to speak with their social worker/Donna.   Butch Penny informed CSW that the pt is welcomed to come to their rehab unit for the night. She also stated that the patient is not DNR. CSW spoke with family.   Family informed CSW that they would like to discuss and possibly sign DNR paperwork at [facility] facility. CSW made Butch Penny of [facility] aware.  Family states that they do not have any questions for CSW at this time.   Willette Brace [phone] ED CSW 01/04/2015 5:01 PM

## 2015-01-04 NOTE — ED Notes (Signed)
Bed: BM15 Expected date:  Expected time:  Means of arrival:  Comments: EMS-diarrhea/hypotensive

## 2015-01-04 NOTE — Discharge Instructions (Signed)

## 2015-01-04 NOTE — Care Management Note (Signed)
Case Management Note  Patient Details  Name: Tony James. MRN: [id number] Date of Birth: 10/08/1922  Subjective/Objective:  Patient presents to the ED with diarrhea and low blood pressure.  Per chart review, diarrhea chronic for 2 years related to prostate cancer.                  Action/Plan:  Spoke to patient, and patient's family at bedside with social worker regarding higher level of care.  Discussed admission status with hospitalist.   Expected Discharge Date:   (unknown)               Expected Discharge Plan:  Home w Hospice Care  In-House Referral:  Clinical Social Work  Discharge planning Services  CM Consult  Post Acute Care Choice:    Choice offered to:     DME Arranged:    DME Agency:     HH Arranged:    [place] Agency:  [facility]  Status of Service:  Completed, signed off  Medicare Important Message Given:    Date Medicare IM Given:    Medicare IM give by:    Date Additional Medicare IM Given:    Additional Medicare Important Message give by:     If discussed at [place] of Stay Meetings, dates discussed:    Additional Comments:  EDCM received consult to speak to patient's family regarding higher level of care/hospice facility placement.  Per patient's daughter Tony James, patient is being cared for by [facility].  Patient's family would rather have patient discharged home this evening than to be admitted.  Patient's family agreeable for [facility] to contact [place] to provide update.  Patient's family requesting patient stay in rehab section of [facility] facility this evening for observation.  EDSW to call [facility].  EDCM spoke to Dispensing optician at [facility] and provided update.  Per Phineas Semen, she will update patient's RN Alysia Penna.  Discussed patient with hospitalist and EDP.  Patient's family thankful for services.  No further EDCM needs at this time.  Christen Bame, Yoan Sallade, RN 01/04/2015, 4:32 PM

## 2015-01-05 ENCOUNTER — Non-Acute Institutional Stay (SKILLED_NURSING_FACILITY): Payer: Medicare Other | Admitting: Internal Medicine

## 2015-01-05 DIAGNOSIS — R627 Adult failure to thrive: Secondary | ICD-10-CM

## 2015-01-05 DIAGNOSIS — C61 Malignant neoplasm of prostate: Secondary | ICD-10-CM | POA: Diagnosis not present

## 2015-01-05 DIAGNOSIS — R918 Other nonspecific abnormal finding of lung field: Secondary | ICD-10-CM

## 2015-01-05 DIAGNOSIS — C7951 Secondary malignant neoplasm of bone: Secondary | ICD-10-CM | POA: Diagnosis not present

## 2015-01-05 DIAGNOSIS — C20 Malignant neoplasm of rectum: Secondary | ICD-10-CM | POA: Diagnosis not present

## 2015-01-05 DIAGNOSIS — N3000 Acute cystitis without hematuria: Secondary | ICD-10-CM | POA: Diagnosis not present

## 2015-01-05 DIAGNOSIS — R1032 Left lower quadrant pain: Secondary | ICD-10-CM | POA: Diagnosis not present

## 2015-01-05 DIAGNOSIS — K529 Noninfective gastroenteritis and colitis, unspecified: Secondary | ICD-10-CM | POA: Diagnosis not present

## 2015-01-05 DIAGNOSIS — L8990 Pressure ulcer of unspecified site, unspecified stage: Secondary | ICD-10-CM | POA: Diagnosis not present
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
## 2015-01-07 LAB — URINE CULTURE: Culture: >=100000

## 2015-01-09 ENCOUNTER — Telehealth: Payer: Self-pay | Admitting: *Deleted

## 2015-01-09 NOTE — ED Notes (Signed)
(+)   urine culture, treated with Cephalexin, OK per pharm 

## 2015-01-10 ENCOUNTER — Encounter: Payer: Self-pay | Admitting: Internal Medicine

## 2015-01-10 DIAGNOSIS — L8990 Pressure ulcer of unspecified site, unspecified stage: Secondary | ICD-10-CM | POA: Insufficient documentation

## 2015-01-10 DIAGNOSIS — C61 Malignant neoplasm of prostate: Secondary | ICD-10-CM | POA: Insufficient documentation

## 2015-01-10 DIAGNOSIS — K529 Noninfective gastroenteritis and colitis, unspecified: Secondary | ICD-10-CM | POA: Insufficient documentation

## 2015-01-10 DIAGNOSIS — R918 Other nonspecific abnormal finding of lung field: Secondary | ICD-10-CM | POA: Insufficient documentation

## 2015-01-10 DIAGNOSIS — R627 Adult failure to thrive: Secondary | ICD-10-CM | POA: Insufficient documentation

## 2015-02-11 DEATH — deceased

## 2015-11-26 ENCOUNTER — Other Ambulatory Visit: Payer: Self-pay | Admitting: Nurse Practitioner
